# Patient Record
Sex: Female | Born: 1935 | Race: White | Hispanic: No | Smoking: Former smoker
Health system: Southern US, Community
[De-identification: ages and names within clinical notes are randomized; demographics above are authoritative.]

## PROBLEM LIST (undated history)

## (undated) DIAGNOSIS — Z5189 Encounter for other specified aftercare: Secondary | ICD-10-CM

## (undated) DIAGNOSIS — I10 Essential (primary) hypertension: Secondary | ICD-10-CM

## (undated) DIAGNOSIS — J449 Chronic obstructive pulmonary disease, unspecified: Secondary | ICD-10-CM

## (undated) DIAGNOSIS — I2510 Atherosclerotic heart disease of native coronary artery without angina pectoris: Secondary | ICD-10-CM

## (undated) DIAGNOSIS — I219 Acute myocardial infarction, unspecified: Secondary | ICD-10-CM

## (undated) HISTORY — PX: CORONARY ANGIOPLASTY: SHX604

## (undated) HISTORY — PX: CARDIAC CATHETERIZATION: SHX172

---

## 2011-04-17 DIAGNOSIS — J069 Acute upper respiratory infection, unspecified: Secondary | ICD-10-CM | POA: Diagnosis not present

## 2011-05-08 DIAGNOSIS — H6120 Impacted cerumen, unspecified ear: Secondary | ICD-10-CM | POA: Diagnosis not present

## 2011-05-08 DIAGNOSIS — F3342 Major depressive disorder, recurrent, in full remission: Secondary | ICD-10-CM | POA: Diagnosis not present

## 2011-05-08 DIAGNOSIS — I10 Essential (primary) hypertension: Secondary | ICD-10-CM | POA: Diagnosis not present

## 2011-05-08 DIAGNOSIS — E785 Hyperlipidemia, unspecified: Secondary | ICD-10-CM | POA: Diagnosis not present

## 2011-05-08 DIAGNOSIS — G2581 Restless legs syndrome: Secondary | ICD-10-CM | POA: Diagnosis not present

## 2011-05-27 ENCOUNTER — Other Ambulatory Visit: Payer: Self-pay | Admitting: Family Medicine

## 2011-05-27 ENCOUNTER — Ambulatory Visit
Admission: RE | Admit: 2011-05-27 | Discharge: 2011-05-27 | Disposition: A | Discharge status: Home / Self Care | Payer: Medicare Other | Source: Ambulatory Visit | Attending: Family Medicine | Admitting: Family Medicine

## 2011-05-27 DIAGNOSIS — R109 Unspecified abdominal pain: Secondary | ICD-10-CM

## 2011-05-27 DIAGNOSIS — K5730 Diverticulosis of large intestine without perforation or abscess without bleeding: Secondary | ICD-10-CM | POA: Diagnosis not present

## 2011-05-27 DIAGNOSIS — K5289 Other specified noninfective gastroenteritis and colitis: Secondary | ICD-10-CM | POA: Diagnosis not present

## 2011-05-27 MED ORDER — IOHEXOL 300 MG/ML  SOLN
100.0000 mL | Freq: Once | INTRAMUSCULAR | Status: AC | PRN
  Administered 2011-05-27: 100 mL via INTRAVENOUS

## 2011-05-27 MED ORDER — IOHEXOL 300 MG/ML  SOLN
40.0000 mL | Freq: Once | INTRAMUSCULAR | Status: AC | PRN
  Administered 2011-05-27: 40 mL via ORAL

## 2011-06-04 DIAGNOSIS — K5732 Diverticulitis of large intestine without perforation or abscess without bleeding: Secondary | ICD-10-CM | POA: Diagnosis not present

## 2011-07-21 DIAGNOSIS — Z9181 History of falling: Secondary | ICD-10-CM | POA: Diagnosis not present

## 2011-07-21 DIAGNOSIS — R5381 Other malaise: Secondary | ICD-10-CM | POA: Diagnosis not present

## 2011-07-21 DIAGNOSIS — R5383 Other fatigue: Secondary | ICD-10-CM | POA: Diagnosis not present

## 2011-07-21 DIAGNOSIS — J449 Chronic obstructive pulmonary disease, unspecified: Secondary | ICD-10-CM | POA: Diagnosis not present

## 2011-07-21 DIAGNOSIS — E871 Hypo-osmolality and hyponatremia: Secondary | ICD-10-CM | POA: Diagnosis not present

## 2011-07-21 DIAGNOSIS — W010XXA Fall on same level from slipping, tripping and stumbling without subsequent striking against object, initial encounter: Secondary | ICD-10-CM | POA: Diagnosis not present

## 2011-07-23 ENCOUNTER — Ambulatory Visit: Payer: Medicare Other | Attending: Family Medicine

## 2011-07-23 DIAGNOSIS — IMO0001 Reserved for inherently not codable concepts without codable children: Secondary | ICD-10-CM | POA: Diagnosis not present

## 2011-07-23 DIAGNOSIS — R262 Difficulty in walking, not elsewhere classified: Secondary | ICD-10-CM | POA: Insufficient documentation

## 2011-07-23 DIAGNOSIS — M6281 Muscle weakness (generalized): Secondary | ICD-10-CM | POA: Diagnosis not present

## 2011-07-24 ENCOUNTER — Ambulatory Visit
Admission: RE | Admit: 2011-07-24 | Discharge: 2011-07-24 | Disposition: A | Discharge status: Home / Self Care | Payer: Medicare Other | Source: Ambulatory Visit | Attending: Family Medicine | Admitting: Family Medicine

## 2011-07-24 ENCOUNTER — Other Ambulatory Visit: Payer: Self-pay | Admitting: Family Medicine

## 2011-07-24 DIAGNOSIS — K449 Diaphragmatic hernia without obstruction or gangrene: Secondary | ICD-10-CM | POA: Diagnosis not present

## 2011-07-24 DIAGNOSIS — I517 Cardiomegaly: Secondary | ICD-10-CM | POA: Diagnosis not present

## 2011-07-24 DIAGNOSIS — E871 Hypo-osmolality and hyponatremia: Secondary | ICD-10-CM

## 2011-07-24 DIAGNOSIS — D649 Anemia, unspecified: Secondary | ICD-10-CM | POA: Diagnosis not present

## 2011-07-27 ENCOUNTER — Ambulatory Visit: Payer: Medicare Other | Admitting: Physical Therapy

## 2011-07-27 DIAGNOSIS — IMO0001 Reserved for inherently not codable concepts without codable children: Secondary | ICD-10-CM | POA: Diagnosis not present

## 2011-07-27 DIAGNOSIS — R262 Difficulty in walking, not elsewhere classified: Secondary | ICD-10-CM | POA: Diagnosis not present

## 2011-07-27 DIAGNOSIS — M6281 Muscle weakness (generalized): Secondary | ICD-10-CM | POA: Diagnosis not present

## 2011-07-30 ENCOUNTER — Ambulatory Visit: Payer: Medicare Other

## 2011-07-30 DIAGNOSIS — J449 Chronic obstructive pulmonary disease, unspecified: Secondary | ICD-10-CM | POA: Diagnosis not present

## 2011-07-30 DIAGNOSIS — R262 Difficulty in walking, not elsewhere classified: Secondary | ICD-10-CM | POA: Diagnosis not present

## 2011-07-30 DIAGNOSIS — M6281 Muscle weakness (generalized): Secondary | ICD-10-CM | POA: Diagnosis not present

## 2011-07-30 DIAGNOSIS — D649 Anemia, unspecified: Secondary | ICD-10-CM | POA: Diagnosis not present

## 2011-07-30 DIAGNOSIS — E871 Hypo-osmolality and hyponatremia: Secondary | ICD-10-CM | POA: Diagnosis not present

## 2011-07-30 DIAGNOSIS — IMO0001 Reserved for inherently not codable concepts without codable children: Secondary | ICD-10-CM | POA: Diagnosis not present

## 2011-08-04 ENCOUNTER — Ambulatory Visit: Payer: Medicare Other | Admitting: Physical Therapy

## 2011-08-06 ENCOUNTER — Ambulatory Visit: Payer: Medicare Other | Admitting: Physical Therapy

## 2011-08-06 DIAGNOSIS — IMO0001 Reserved for inherently not codable concepts without codable children: Secondary | ICD-10-CM | POA: Diagnosis not present

## 2011-08-06 DIAGNOSIS — M6281 Muscle weakness (generalized): Secondary | ICD-10-CM | POA: Diagnosis not present

## 2011-08-06 DIAGNOSIS — R262 Difficulty in walking, not elsewhere classified: Secondary | ICD-10-CM | POA: Diagnosis not present

## 2011-08-11 ENCOUNTER — Ambulatory Visit: Payer: Medicare Other

## 2011-08-11 DIAGNOSIS — IMO0001 Reserved for inherently not codable concepts without codable children: Secondary | ICD-10-CM | POA: Diagnosis not present

## 2011-08-11 DIAGNOSIS — M6281 Muscle weakness (generalized): Secondary | ICD-10-CM | POA: Diagnosis not present

## 2011-08-11 DIAGNOSIS — R262 Difficulty in walking, not elsewhere classified: Secondary | ICD-10-CM | POA: Diagnosis not present

## 2011-08-13 ENCOUNTER — Ambulatory Visit: Payer: Medicare Other

## 2011-08-13 DIAGNOSIS — IMO0001 Reserved for inherently not codable concepts without codable children: Secondary | ICD-10-CM | POA: Diagnosis not present

## 2011-08-13 DIAGNOSIS — M6281 Muscle weakness (generalized): Secondary | ICD-10-CM | POA: Diagnosis not present

## 2011-08-13 DIAGNOSIS — R262 Difficulty in walking, not elsewhere classified: Secondary | ICD-10-CM | POA: Diagnosis not present

## 2011-08-20 ENCOUNTER — Ambulatory Visit: Payer: Medicare Other | Attending: Family Medicine

## 2011-08-20 DIAGNOSIS — R262 Difficulty in walking, not elsewhere classified: Secondary | ICD-10-CM | POA: Diagnosis not present

## 2011-08-20 DIAGNOSIS — M6281 Muscle weakness (generalized): Secondary | ICD-10-CM | POA: Insufficient documentation

## 2011-08-20 DIAGNOSIS — IMO0001 Reserved for inherently not codable concepts without codable children: Secondary | ICD-10-CM | POA: Insufficient documentation

## 2011-11-18 DIAGNOSIS — J449 Chronic obstructive pulmonary disease, unspecified: Secondary | ICD-10-CM | POA: Diagnosis not present

## 2011-11-18 DIAGNOSIS — F3342 Major depressive disorder, recurrent, in full remission: Secondary | ICD-10-CM | POA: Diagnosis not present

## 2011-11-18 DIAGNOSIS — I10 Essential (primary) hypertension: Secondary | ICD-10-CM | POA: Diagnosis not present

## 2011-11-18 DIAGNOSIS — E785 Hyperlipidemia, unspecified: Secondary | ICD-10-CM | POA: Diagnosis not present

## 2012-02-23 DIAGNOSIS — E871 Hypo-osmolality and hyponatremia: Secondary | ICD-10-CM | POA: Diagnosis not present

## 2012-02-23 DIAGNOSIS — I10 Essential (primary) hypertension: Secondary | ICD-10-CM | POA: Diagnosis not present

## 2012-02-23 DIAGNOSIS — J449 Chronic obstructive pulmonary disease, unspecified: Secondary | ICD-10-CM | POA: Diagnosis not present

## 2012-05-23 DIAGNOSIS — G2581 Restless legs syndrome: Secondary | ICD-10-CM | POA: Diagnosis not present

## 2012-05-23 DIAGNOSIS — J449 Chronic obstructive pulmonary disease, unspecified: Secondary | ICD-10-CM | POA: Diagnosis not present

## 2012-05-23 DIAGNOSIS — E785 Hyperlipidemia, unspecified: Secondary | ICD-10-CM | POA: Diagnosis not present

## 2012-05-23 DIAGNOSIS — I10 Essential (primary) hypertension: Secondary | ICD-10-CM | POA: Diagnosis not present

## 2012-05-23 DIAGNOSIS — L259 Unspecified contact dermatitis, unspecified cause: Secondary | ICD-10-CM | POA: Diagnosis not present

## 2012-06-08 IMAGING — CT CT ABD-PELV W/ CM
3 of 5 series · 12 of 36 positions shown, 18 images · IV contrast (OMNI 300, WATER & [ID] OMNI 300)
Comparison: None.

CLINICAL DATA: Abdominal pain, concern diverticulitis or ischemic
colitis.

CT ABDOMEN AND PELVIS WITH CONTRAST
TECHNIQUE: Multidetector CT imaging of the abdomen and pelvis was
performed following the standard protocol during bolus
administration of intravenous contrast.
Contrast: 40mL OMNIPAQUE IOHEXOL 300 MG/ML IJ SOLN, 100mL OMNIPAQUE
IOHEXOL 300 MG/ML IJ SOLN

[Series 3: abd/pelvis with · axial · 0.70mm/px · z∈[-310,-4]mm · 8 of 79 slices shown, 13 images]
[im 9/79  soft-tissue]
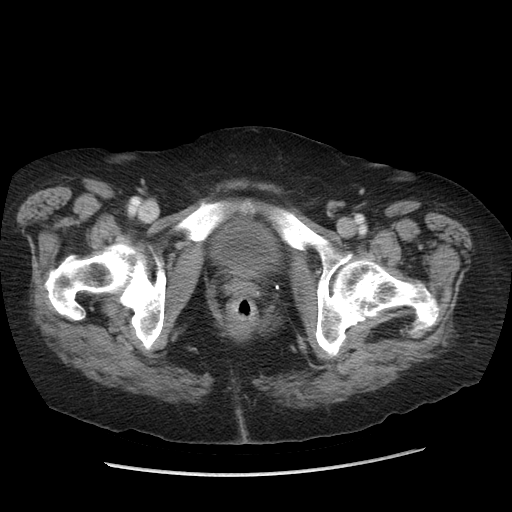
[im 9/79  bone]
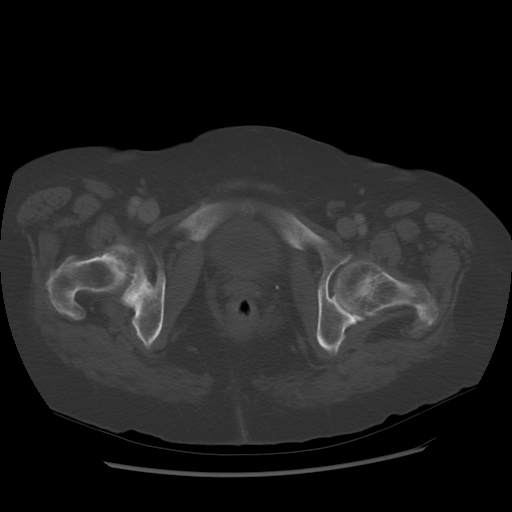
[im 18/79  soft-tissue]
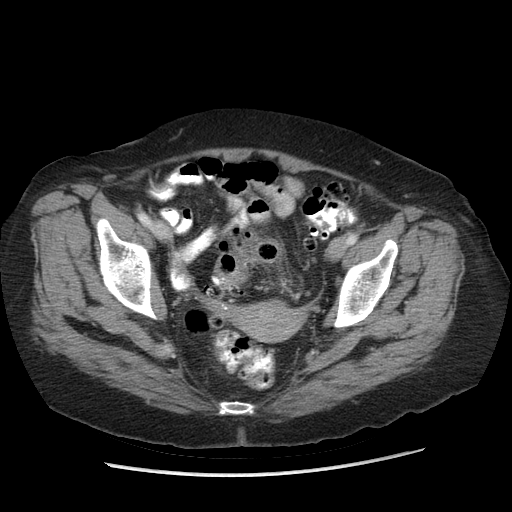
[im 27/79  soft-tissue]
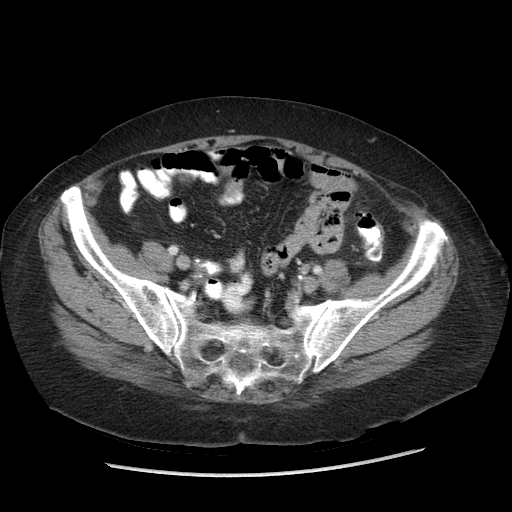
[im 35/79  soft-tissue]
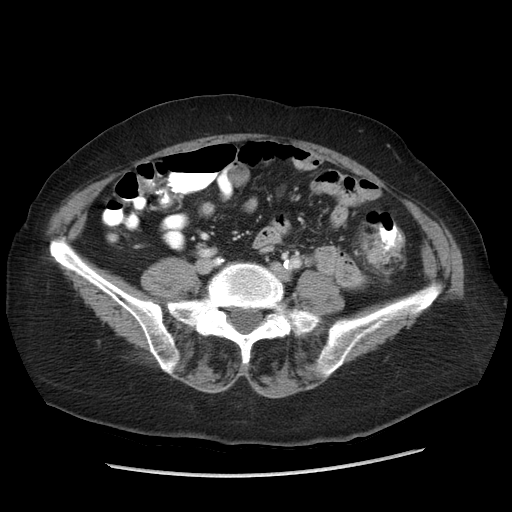
[im 44/79  soft-tissue]
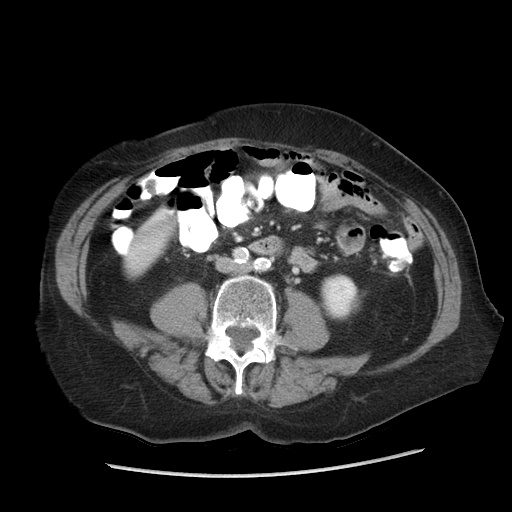
[im 44/79  lung]
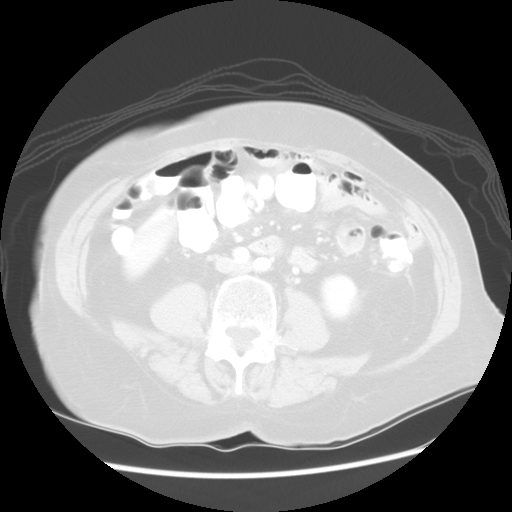
[im 53/79  soft-tissue]
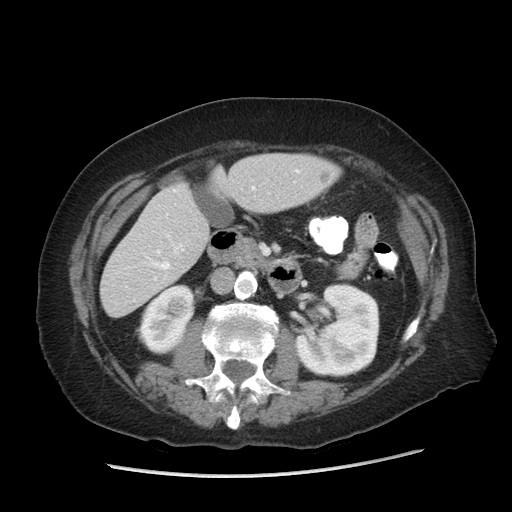
[im 53/79  lung]
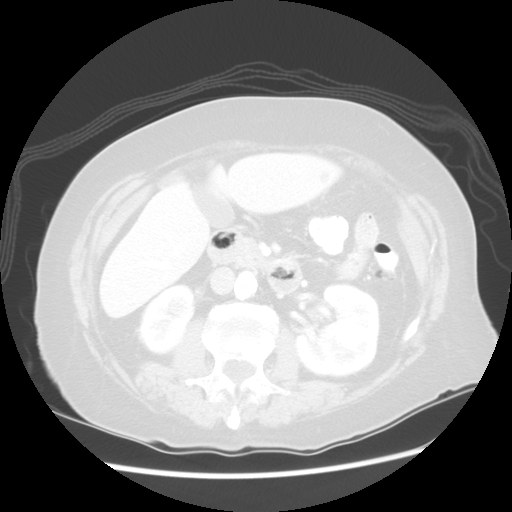
[im 61/79  soft-tissue]
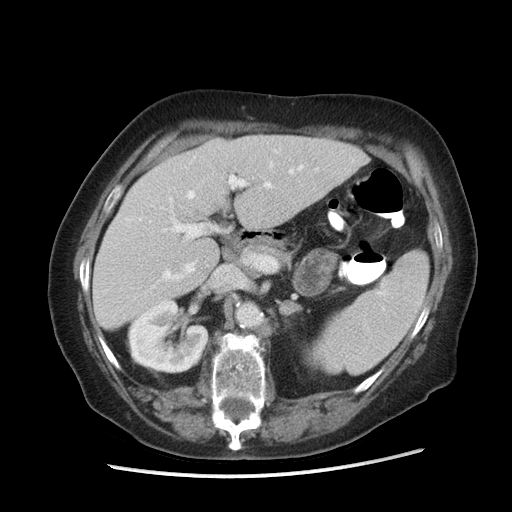
[im 61/79  lung]
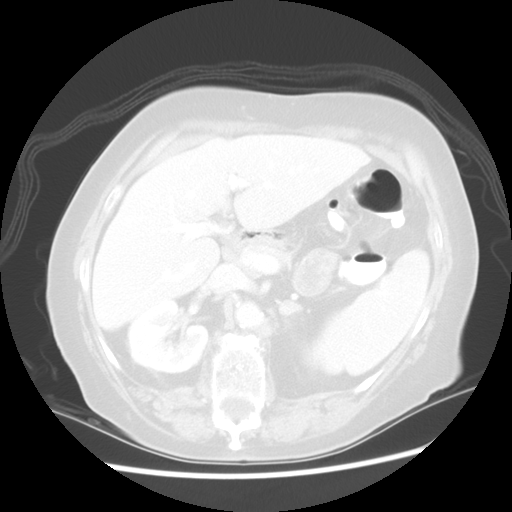
[im 70/79  soft-tissue]
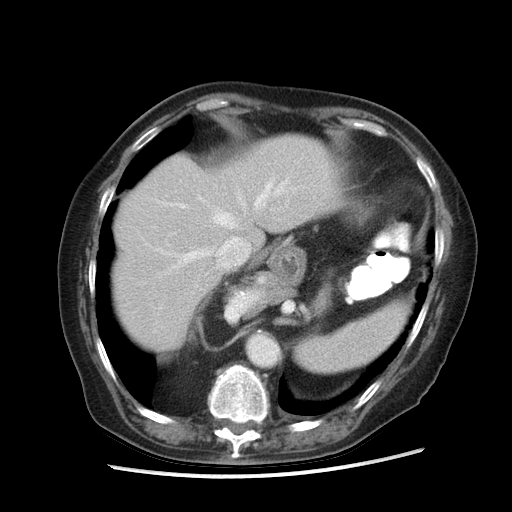
[im 70/79  lung]
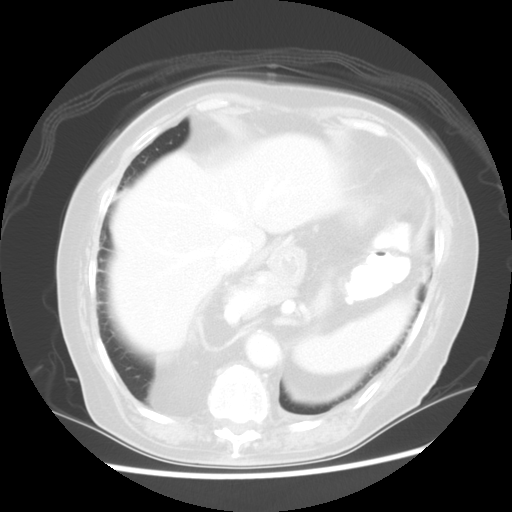

[Series 601: coronal body · coronal · 0.83mm/px · 1 of 117 slices shown, 2 images]
[im 39/117  soft-tissue]
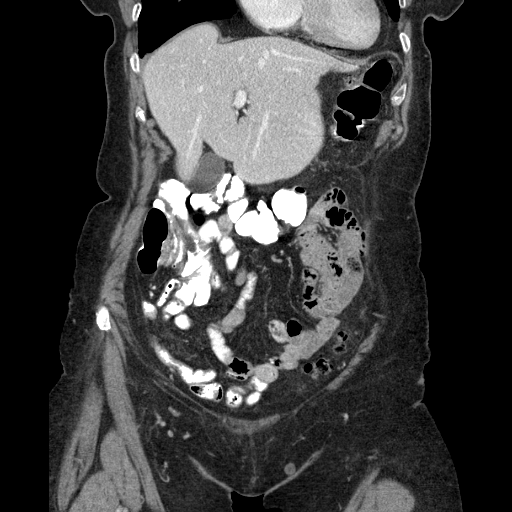
[im 39/117  bone]
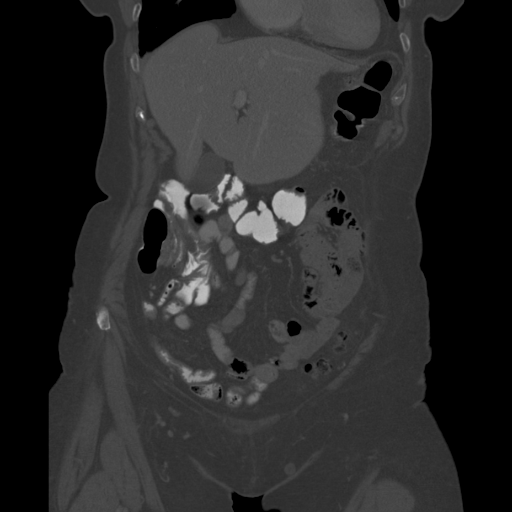

[Series 602: sagittal body · sagittal · 0.83mm/px · 3 of 145 slices shown]
[im 10/145  soft-tissue]
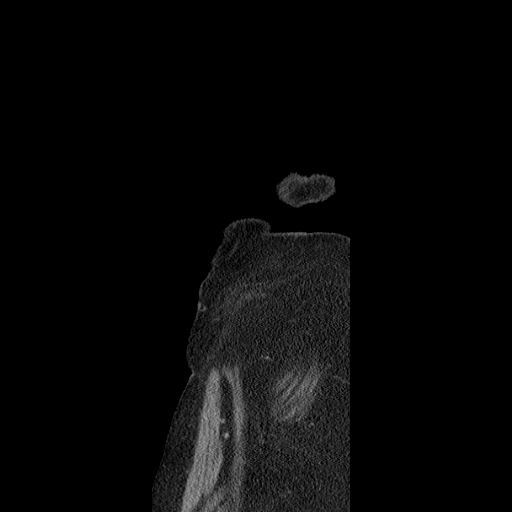
[im 28/145  soft-tissue]
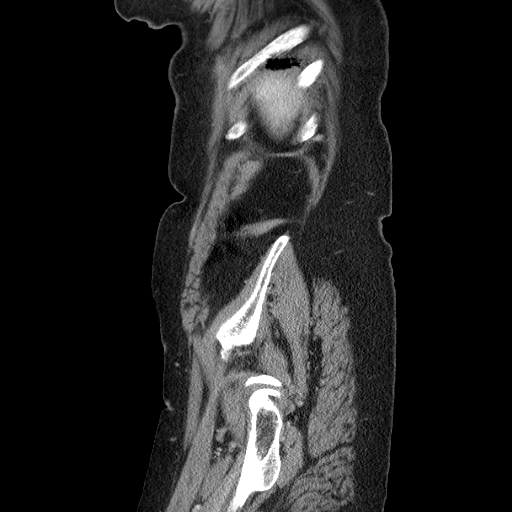
[im 46/145  soft-tissue]
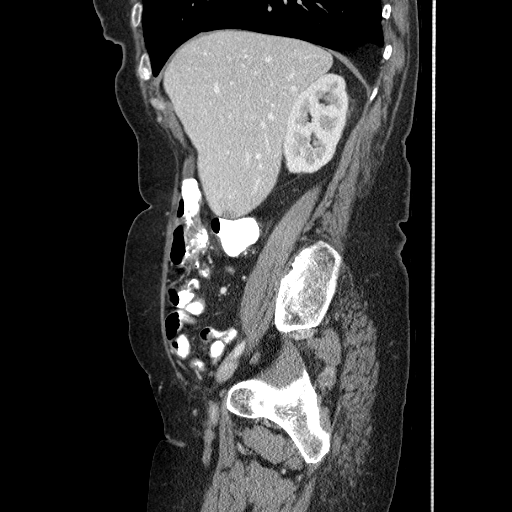

[12 of 36 positions shown; findings below may reference images not displayed]

FINDINGS: Lung bases are clear.  There is a large hiatal hernia
with the entire stomach above the hemidiaphragms within the hernia
sac.

There is a small hypodense lesion within the lateral left hepatic
lobe measuring 8 mm (image 27).  Gallbladder, pancreas, spleen,
adrenal glands, kidneys are normal.

The duodenum and small bowel are normal.  Appendix is normal.
Cecum is normal.  There is pericolonic inflammation within the fat
surrounding the mid descending colon (image 45).  There are several
diverticula through this region.  There is an enlarged and
diverticulum in the proximal sigmoid colon (coronal image 56) which
also demonstrates a pericolonic inflammation.  There are multiple
diverticula through the sigmoid colon region.  Contrast flows in
the rectum.  No evidence of macro perforation or abscess.

Abdominal aorta normal caliber.  No retroperitoneal periportal
lymphadenopathy.

No free fluid the pelvis.  The uterus and ovary normal.  No pelvic
lymphadenopathy. Review of  bone windows demonstrates no aggressive
osseous lesions.
IMPRESSION: 1..  Inflammation involving the mid descending colon as well as the
mid sigmoid colon are concerning for a two discrete foci of acute
diverticulitis.  There is no evidence of abscess or macro
perforation.
2.  Extensive diverticular disease of the descending colon and
sigmoid colon.
3.  Large hernia with the entire stomach above the hemidiaphragms.
4..  Small hypodensity within the lateral left hepatic lobe is
incompletely characterized..  Cannot exclude a small abscess in
patient with diverticulitis although this is less favored.

## 2012-06-21 DIAGNOSIS — I10 Essential (primary) hypertension: Secondary | ICD-10-CM | POA: Diagnosis not present

## 2012-06-21 DIAGNOSIS — M549 Dorsalgia, unspecified: Secondary | ICD-10-CM | POA: Diagnosis not present

## 2012-06-21 DIAGNOSIS — E871 Hypo-osmolality and hyponatremia: Secondary | ICD-10-CM | POA: Diagnosis not present

## 2012-06-28 ENCOUNTER — Ambulatory Visit
Admission: RE | Admit: 2012-06-28 | Discharge: 2012-06-28 | Disposition: A | Discharge status: Home / Self Care | Payer: Medicare Other | Source: Ambulatory Visit | Attending: Family Medicine | Admitting: Family Medicine

## 2012-06-28 ENCOUNTER — Other Ambulatory Visit: Payer: Self-pay | Admitting: Family Medicine

## 2012-06-28 DIAGNOSIS — M549 Dorsalgia, unspecified: Secondary | ICD-10-CM | POA: Diagnosis not present

## 2012-06-28 DIAGNOSIS — I10 Essential (primary) hypertension: Secondary | ICD-10-CM | POA: Diagnosis not present

## 2012-06-28 DIAGNOSIS — R0602 Shortness of breath: Secondary | ICD-10-CM | POA: Diagnosis not present

## 2012-06-28 DIAGNOSIS — S298XXA Other specified injuries of thorax, initial encounter: Secondary | ICD-10-CM | POA: Diagnosis not present

## 2012-06-28 DIAGNOSIS — S32009A Unspecified fracture of unspecified lumbar vertebra, initial encounter for closed fracture: Secondary | ICD-10-CM | POA: Diagnosis not present

## 2012-06-28 DIAGNOSIS — E871 Hypo-osmolality and hyponatremia: Secondary | ICD-10-CM | POA: Diagnosis not present

## 2012-09-01 DIAGNOSIS — M545 Low back pain: Secondary | ICD-10-CM | POA: Diagnosis not present

## 2012-09-01 DIAGNOSIS — S32009A Unspecified fracture of unspecified lumbar vertebra, initial encounter for closed fracture: Secondary | ICD-10-CM | POA: Diagnosis not present

## 2012-09-02 DIAGNOSIS — I10 Essential (primary) hypertension: Secondary | ICD-10-CM | POA: Diagnosis not present

## 2012-09-02 DIAGNOSIS — G2581 Restless legs syndrome: Secondary | ICD-10-CM | POA: Diagnosis not present

## 2012-09-02 DIAGNOSIS — E871 Hypo-osmolality and hyponatremia: Secondary | ICD-10-CM | POA: Diagnosis not present

## 2012-09-02 DIAGNOSIS — J449 Chronic obstructive pulmonary disease, unspecified: Secondary | ICD-10-CM | POA: Diagnosis not present

## 2012-09-02 DIAGNOSIS — M549 Dorsalgia, unspecified: Secondary | ICD-10-CM | POA: Diagnosis not present

## 2012-11-16 DIAGNOSIS — J449 Chronic obstructive pulmonary disease, unspecified: Secondary | ICD-10-CM | POA: Diagnosis not present

## 2012-11-16 DIAGNOSIS — Z23 Encounter for immunization: Secondary | ICD-10-CM | POA: Diagnosis not present

## 2012-11-16 DIAGNOSIS — I10 Essential (primary) hypertension: Secondary | ICD-10-CM | POA: Diagnosis not present

## 2012-11-16 DIAGNOSIS — E871 Hypo-osmolality and hyponatremia: Secondary | ICD-10-CM | POA: Diagnosis not present

## 2013-05-04 DIAGNOSIS — I2510 Atherosclerotic heart disease of native coronary artery without angina pectoris: Secondary | ICD-10-CM | POA: Diagnosis not present

## 2013-05-04 DIAGNOSIS — I252 Old myocardial infarction: Secondary | ICD-10-CM | POA: Diagnosis not present

## 2013-05-04 DIAGNOSIS — D649 Anemia, unspecified: Secondary | ICD-10-CM | POA: Diagnosis not present

## 2013-05-04 DIAGNOSIS — I10 Essential (primary) hypertension: Secondary | ICD-10-CM | POA: Diagnosis not present

## 2013-05-04 DIAGNOSIS — I509 Heart failure, unspecified: Secondary | ICD-10-CM | POA: Diagnosis not present

## 2013-06-26 DIAGNOSIS — E785 Hyperlipidemia, unspecified: Secondary | ICD-10-CM | POA: Diagnosis not present

## 2013-06-26 DIAGNOSIS — E871 Hypo-osmolality and hyponatremia: Secondary | ICD-10-CM | POA: Diagnosis not present

## 2013-06-26 DIAGNOSIS — J449 Chronic obstructive pulmonary disease, unspecified: Secondary | ICD-10-CM | POA: Diagnosis not present

## 2013-06-26 DIAGNOSIS — E559 Vitamin D deficiency, unspecified: Secondary | ICD-10-CM | POA: Diagnosis not present

## 2013-06-26 DIAGNOSIS — I10 Essential (primary) hypertension: Secondary | ICD-10-CM | POA: Diagnosis not present

## 2013-09-18 DIAGNOSIS — Z862 Personal history of diseases of the blood and blood-forming organs and certain disorders involving the immune mechanism: Secondary | ICD-10-CM | POA: Diagnosis not present

## 2013-09-18 DIAGNOSIS — R5381 Other malaise: Secondary | ICD-10-CM | POA: Diagnosis not present

## 2013-09-18 DIAGNOSIS — R42 Dizziness and giddiness: Secondary | ICD-10-CM | POA: Diagnosis not present

## 2013-09-18 DIAGNOSIS — E559 Vitamin D deficiency, unspecified: Secondary | ICD-10-CM | POA: Diagnosis not present

## 2013-09-18 DIAGNOSIS — E871 Hypo-osmolality and hyponatremia: Secondary | ICD-10-CM | POA: Diagnosis not present

## 2013-11-09 DIAGNOSIS — E559 Vitamin D deficiency, unspecified: Secondary | ICD-10-CM | POA: Diagnosis not present

## 2013-11-09 DIAGNOSIS — Z0000 Encounter for general adult medical examination without abnormal findings: Secondary | ICD-10-CM | POA: Diagnosis not present

## 2013-11-09 DIAGNOSIS — E785 Hyperlipidemia, unspecified: Secondary | ICD-10-CM | POA: Diagnosis not present

## 2013-11-09 DIAGNOSIS — N951 Menopausal and female climacteric states: Secondary | ICD-10-CM | POA: Diagnosis not present

## 2013-11-09 DIAGNOSIS — I10 Essential (primary) hypertension: Secondary | ICD-10-CM | POA: Diagnosis not present

## 2013-11-09 DIAGNOSIS — F3342 Major depressive disorder, recurrent, in full remission: Secondary | ICD-10-CM | POA: Diagnosis not present

## 2013-11-23 DIAGNOSIS — M810 Age-related osteoporosis without current pathological fracture: Secondary | ICD-10-CM | POA: Diagnosis not present

## 2014-01-30 DIAGNOSIS — R531 Weakness: Secondary | ICD-10-CM | POA: Diagnosis not present

## 2014-01-30 DIAGNOSIS — E860 Dehydration: Secondary | ICD-10-CM | POA: Diagnosis not present

## 2014-01-30 DIAGNOSIS — I10 Essential (primary) hypertension: Secondary | ICD-10-CM | POA: Diagnosis not present

## 2014-02-21 DIAGNOSIS — E871 Hypo-osmolality and hyponatremia: Secondary | ICD-10-CM | POA: Diagnosis not present

## 2014-02-21 DIAGNOSIS — J449 Chronic obstructive pulmonary disease, unspecified: Secondary | ICD-10-CM | POA: Diagnosis not present

## 2014-02-21 DIAGNOSIS — E559 Vitamin D deficiency, unspecified: Secondary | ICD-10-CM | POA: Diagnosis not present

## 2014-02-21 DIAGNOSIS — E785 Hyperlipidemia, unspecified: Secondary | ICD-10-CM | POA: Diagnosis not present

## 2014-02-21 DIAGNOSIS — I10 Essential (primary) hypertension: Secondary | ICD-10-CM | POA: Diagnosis not present

## 2014-02-21 DIAGNOSIS — Z23 Encounter for immunization: Secondary | ICD-10-CM | POA: Diagnosis not present

## 2014-04-20 ENCOUNTER — Emergency Department (HOSPITAL_COMMUNITY): Payer: Medicare Other

## 2014-04-20 ENCOUNTER — Other Ambulatory Visit: Payer: Self-pay | Admitting: Family Medicine

## 2014-04-20 ENCOUNTER — Ambulatory Visit
Admission: RE | Admit: 2014-04-20 | Discharge: 2014-04-20 | Disposition: A | Discharge status: Home / Self Care | Payer: Medicare Other | Source: Ambulatory Visit | Attending: Family Medicine | Admitting: Family Medicine

## 2014-04-20 ENCOUNTER — Encounter (HOSPITAL_COMMUNITY): Payer: Self-pay | Admitting: Emergency Medicine

## 2014-04-20 ENCOUNTER — Inpatient Hospital Stay (HOSPITAL_COMMUNITY)
Admission: RE | Admit: 2014-04-20 | Discharge: 2014-04-23 | DRG: 246 | Disposition: A | Discharge status: Home / Self Care | Payer: Medicare Other | Attending: Cardiology | Admitting: Cardiology

## 2014-04-20 DIAGNOSIS — M810 Age-related osteoporosis without current pathological fracture: Secondary | ICD-10-CM | POA: Diagnosis present

## 2014-04-20 DIAGNOSIS — D500 Iron deficiency anemia secondary to blood loss (chronic): Secondary | ICD-10-CM | POA: Diagnosis present

## 2014-04-20 DIAGNOSIS — I2109 ST elevation (STEMI) myocardial infarction involving other coronary artery of anterior wall: Secondary | ICD-10-CM | POA: Diagnosis not present

## 2014-04-20 DIAGNOSIS — Z87891 Personal history of nicotine dependence: Secondary | ICD-10-CM

## 2014-04-20 DIAGNOSIS — I214 Non-ST elevation (NSTEMI) myocardial infarction: Principal | ICD-10-CM | POA: Diagnosis present

## 2014-04-20 DIAGNOSIS — R0602 Shortness of breath: Secondary | ICD-10-CM | POA: Diagnosis not present

## 2014-04-20 DIAGNOSIS — E785 Hyperlipidemia, unspecified: Secondary | ICD-10-CM | POA: Diagnosis not present

## 2014-04-20 DIAGNOSIS — I10 Essential (primary) hypertension: Secondary | ICD-10-CM | POA: Diagnosis present

## 2014-04-20 DIAGNOSIS — R079 Chest pain, unspecified: Secondary | ICD-10-CM | POA: Diagnosis not present

## 2014-04-20 DIAGNOSIS — E780 Pure hypercholesterolemia: Secondary | ICD-10-CM | POA: Diagnosis present

## 2014-04-20 DIAGNOSIS — K449 Diaphragmatic hernia without obstruction or gangrene: Secondary | ICD-10-CM | POA: Diagnosis present

## 2014-04-20 DIAGNOSIS — J449 Chronic obstructive pulmonary disease, unspecified: Secondary | ICD-10-CM

## 2014-04-20 DIAGNOSIS — I5023 Acute on chronic systolic (congestive) heart failure: Secondary | ICD-10-CM | POA: Diagnosis present

## 2014-04-20 DIAGNOSIS — F17290 Nicotine dependence, other tobacco product, uncomplicated: Secondary | ICD-10-CM | POA: Diagnosis not present

## 2014-04-20 DIAGNOSIS — D649 Anemia, unspecified: Secondary | ICD-10-CM | POA: Diagnosis not present

## 2014-04-20 DIAGNOSIS — I5020 Unspecified systolic (congestive) heart failure: Secondary | ICD-10-CM | POA: Diagnosis not present

## 2014-04-20 DIAGNOSIS — R0789 Other chest pain: Secondary | ICD-10-CM | POA: Diagnosis not present

## 2014-04-20 DIAGNOSIS — M1990 Unspecified osteoarthritis, unspecified site: Secondary | ICD-10-CM | POA: Diagnosis present

## 2014-04-20 DIAGNOSIS — R52 Pain, unspecified: Secondary | ICD-10-CM

## 2014-04-20 DIAGNOSIS — R7309 Other abnormal glucose: Secondary | ICD-10-CM | POA: Diagnosis present

## 2014-04-20 HISTORY — DX: Acute myocardial infarction, unspecified: I21.9

## 2014-04-20 HISTORY — DX: Encounter for other specified aftercare: Z51.89

## 2014-04-20 HISTORY — DX: Atherosclerotic heart disease of native coronary artery without angina pectoris: I25.10

## 2014-04-20 HISTORY — DX: Essential (primary) hypertension: I10

## 2014-04-20 HISTORY — DX: Chronic obstructive pulmonary disease, unspecified: J44.9

## 2014-04-20 LAB — I-STAT TROPONIN, ED: TROPONIN I, POC: 1.47 ng/mL — AB (ref 0.00–0.08)

## 2014-04-20 LAB — CBC
HCT: 35.4 % — ABNORMAL LOW (ref 36.0–46.0)
HEMATOCRIT: 36.1 % (ref 36.0–46.0)
Hemoglobin: 11.5 g/dL — ABNORMAL LOW (ref 12.0–15.0)
Hemoglobin: 11.4 g/dL — ABNORMAL LOW (ref 12.0–15.0)
MCH: 30.6 pg (ref 26.0–34.0)
MCH: 30.8 pg (ref 26.0–34.0)
MCHC: 31.9 g/dL (ref 30.0–36.0)
MCHC: 32.2 g/dL (ref 30.0–36.0)
MCV: 96 fL (ref 78.0–100.0)
MCV: 95.7 fL (ref 78.0–100.0)
Platelets: 221 10*3/uL (ref 150–400)
Platelets: 205 10*3/uL (ref 150–400)
RBC: 3.76 MIL/uL — ABNORMAL LOW (ref 3.87–5.11)
RBC: 3.7 MIL/uL — ABNORMAL LOW (ref 3.87–5.11)
RDW: 12.5 % (ref 11.5–15.5)
RDW: 12.4 % (ref 11.5–15.5)
WBC: 7.5 10*3/uL (ref 4.0–10.5)
WBC: 6.4 10*3/uL (ref 4.0–10.5)

## 2014-04-20 LAB — BASIC METABOLIC PANEL
ANION GAP: 8 (ref 5–15)
BUN: 18 mg/dL (ref 6–23)
CALCIUM: 8.7 mg/dL (ref 8.4–10.5)
CHLORIDE: 101 mmol/L (ref 96–112)
CO2: 24 mmol/L (ref 19–32)
Creatinine, Ser: 1.04 mg/dL (ref 0.50–1.10)
GFR calc Af Amer: 57 mL/min — ABNORMAL LOW (ref >90)
GFR, EST NON AFRICAN AMERICAN: 49 mL/min — AB (ref >90)
Glucose, Bld: 118 mg/dL — ABNORMAL HIGH (ref 70–99)
Potassium: 3.7 mmol/L (ref 3.5–5.1)
Sodium: 133 mmol/L — ABNORMAL LOW (ref 135–145)

## 2014-04-20 LAB — APTT: aPTT: 37 seconds (ref 24–37)

## 2014-04-20 LAB — PROTIME-INR
INR: 0.98 (ref 0.00–1.49)
Prothrombin Time: 13.1 seconds (ref 11.6–15.2)

## 2014-04-20 LAB — BRAIN NATRIURETIC PEPTIDE: B Natriuretic Peptide: 1037.5 pg/mL — ABNORMAL HIGH (ref 0.0–100.0)

## 2014-04-20 MED ORDER — NITROGLYCERIN IN D5W 200-5 MCG/ML-% IV SOLN
0.0000 ug/min | INTRAVENOUS | Status: DC
  Administered 2014-04-20: 5 ug/min via INTRAVENOUS
  Filled 2014-04-20: qty 250

## 2014-04-20 MED ORDER — NITROGLYCERIN 0.4 MG SL SUBL
0.4000 mg | SUBLINGUAL_TABLET | SUBLINGUAL | Status: DC | PRN
Start: 2014-04-20 — End: 2014-04-23

## 2014-04-20 MED ORDER — METOPROLOL TARTRATE 12.5 MG HALF TABLET
12.5000 mg | ORAL_TABLET | Freq: Two times a day (BID) | ORAL | Status: DC
  Administered 2014-04-20 – 2014-04-22 (×4): 12.5 mg via ORAL
  Filled 2014-04-20 (×5): qty 1

## 2014-04-20 MED ORDER — HEPARIN (PORCINE) IN NACL 100-0.45 UNIT/ML-% IJ SOLN
950.0000 [IU]/h | INTRAMUSCULAR | Status: DC
  Administered 2014-04-20 (×2): 800 [IU]/h via INTRAVENOUS
  Filled 2014-04-20 (×3): qty 250

## 2014-04-20 MED ORDER — HEPARIN (PORCINE) IN NACL 100-0.45 UNIT/ML-% IJ SOLN: 10.0000 [IU]/kg/h | INTRAMUSCULAR | Status: DC

## 2014-04-20 MED ORDER — CLONIDINE HCL 0.1 MG PO TABS
0.1000 mg | ORAL_TABLET | Freq: Two times a day (BID) | ORAL | Status: DC
  Administered 2014-04-20 – 2014-04-21 (×2): 0.1 mg via ORAL
  Filled 2014-04-20 (×3): qty 1

## 2014-04-20 MED ORDER — CLOPIDOGREL BISULFATE 75 MG PO TABS
75.0000 mg | ORAL_TABLET | Freq: Every day | ORAL | Status: DC
  Administered 2014-04-21 – 2014-04-23 (×3): 75 mg via ORAL
  Filled 2014-04-20 (×4): qty 1

## 2014-04-20 MED ORDER — ASPIRIN 81 MG PO CHEW
324.0000 mg | CHEWABLE_TABLET | Freq: Once | ORAL | Status: DC
  Filled 2014-04-20: qty 4

## 2014-04-20 MED ORDER — HEPARIN BOLUS VIA INFUSION
1400.0000 [IU] | Freq: Once | INTRAVENOUS | Status: AC
  Administered 2014-04-20: 1400 [IU] via INTRAVENOUS
  Filled 2014-04-20: qty 1400

## 2014-04-20 MED ORDER — INSULIN ASPART 100 UNIT/ML ~~LOC~~ SOLN: 0.0000 [IU] | Freq: Three times a day (TID) | SUBCUTANEOUS | Status: DC

## 2014-04-20 MED ORDER — NITROGLYCERIN IN D5W 200-5 MCG/ML-% IV SOLN: 10.0000 ug/min | INTRAVENOUS | Status: DC

## 2014-04-20 MED ORDER — MORPHINE SULFATE 2 MG/ML IJ SOLN
1.0000 mg | INTRAMUSCULAR | Status: DC | PRN
  Administered 2014-04-21 (×3): 1 mg via INTRAVENOUS
  Filled 2014-04-20 (×3): qty 1

## 2014-04-20 MED ORDER — ATORVASTATIN CALCIUM 40 MG PO TABS
40.0000 mg | ORAL_TABLET | Freq: Every day | ORAL | Status: DC
  Administered 2014-04-20 – 2014-04-22 (×3): 40 mg via ORAL
  Filled 2014-04-20 (×4): qty 1

## 2014-04-20 MED ORDER — PANTOPRAZOLE SODIUM 40 MG PO TBEC
40.0000 mg | DELAYED_RELEASE_TABLET | Freq: Two times a day (BID) | ORAL | Status: DC
  Administered 2014-04-20 – 2014-04-23 (×6): 40 mg via ORAL
  Filled 2014-04-20 (×6): qty 1

## 2014-04-20 MED ORDER — ASPIRIN 300 MG RE SUPP: 300.0000 mg | RECTAL | Status: DC

## 2014-04-20 MED ORDER — ONDANSETRON HCL 4 MG/2ML IJ SOLN
4.0000 mg | Freq: Four times a day (QID) | INTRAMUSCULAR | Status: DC | PRN
Start: 2014-04-20 — End: 2014-04-23
  Administered 2014-04-21: 4 mg via INTRAVENOUS
  Filled 2014-04-20: qty 2

## 2014-04-20 MED ORDER — RAMIPRIL 5 MG PO CAPS
5.0000 mg | ORAL_CAPSULE | Freq: Every day | ORAL | Status: DC
  Administered 2014-04-20 – 2014-04-21 (×2): 5 mg via ORAL
  Filled 2014-04-20 (×2): qty 1

## 2014-04-20 MED ORDER — ACETAMINOPHEN 325 MG PO TABS
650.0000 mg | ORAL_TABLET | ORAL | Status: DC | PRN
  Administered 2014-04-21 (×2): 650 mg via ORAL
  Filled 2014-04-20 (×2): qty 2

## 2014-04-20 MED ORDER — ACETAMINOPHEN 325 MG PO TABS: 650.0000 mg | ORAL_TABLET | Freq: Four times a day (QID) | ORAL | Status: DC | PRN

## 2014-04-20 MED ORDER — ASPIRIN 81 MG PO CHEW: 324.0000 mg | CHEWABLE_TABLET | ORAL | Status: DC

## 2014-04-20 MED ORDER — ASPIRIN EC 81 MG PO TBEC
81.0000 mg | DELAYED_RELEASE_TABLET | Freq: Every day | ORAL | Status: DC
  Administered 2014-04-21 – 2014-04-23 (×3): 81 mg via ORAL
  Filled 2014-04-20 (×3): qty 1

## 2014-04-20 MED ORDER — CLOPIDOGREL BISULFATE 300 MG PO TABS
300.0000 mg | ORAL_TABLET | Freq: Once | ORAL | Status: AC
  Administered 2014-04-20: 300 mg via ORAL
  Filled 2014-04-20: qty 1

## 2014-04-20 MED ORDER — SODIUM CHLORIDE 0.9 % IV SOLN
INTRAVENOUS | Status: DC
  Administered 2014-04-21: via INTRAVENOUS

## 2014-04-20 NOTE — H&P (Signed)
April Rice is an 81 y.o. female.   Chief Complaint:  Chest  pain associated with shortness of breath HPI: Patient is 81 year old female with past medical history significant for hypertension, COPD, remote tobacco abuse, hiatus hernia, history of hyponatremia, chronic anemia, degenerative joint disease, complaints off recurrent retrosternal chest pressure described as heaviness associated with shortness of breath off and on for 2 days states last night her chest pressure was severe described as heaviness associated with shortness of breath radiating to right side of neck so decided to go to [facility]D this morning had labs done and was noted to have elevated troponin I about 3 and was advised to come to the ED. EKG done in the ER showed normal sinus rhythm with Q waves in V1 and V2 and ST T-wave changes in anterolateral leads and was noted to have troponin I of 1.47 which is trending down. Patient presently denies any chest pain. Denies any nausea vomiting diaphoresis. Denies palpitation lightheadedness or syncope. States had similar discomfort approximately one week ago associated with shortness of breath but did not seek medical attention.  No past medical history on file.  No past surgical history on file.  No family history on file. Social History:  has no tobacco, alcohol, and drug history on file.  Allergies: No Known Allergies   (Not in a hospital admission)  Results for orders placed or performed during the hospital encounter of 04/20/14 (from the past 48 hour(s))  CBC     Status: Abnormal   Collection Time: 04/20/14  4:29 PM  Result Value Ref Range   WBC 7.5 4.0 - 10.5 K/uL   RBC 3.76 (L) 3.87 - 5.11 MIL/uL   Hemoglobin 11.5 (L) 12.0 - 15.0 g/dL   HCT 36.1 36.0 - 46.0 %   MCV 96.0 78.0 - 100.0 fL   MCH 30.6 26.0 - 34.0 pg   MCHC 31.9 30.0 - 36.0 g/dL   RDW 12.5 11.5 - 15.5 %   Platelets 221 150 - 400 K/uL  Basic metabolic panel     Status: Abnormal   Collection Time: 04/20/14  4:29 PM   Result Value Ref Range   Sodium 133 (L) 135 - 145 mmol/L   Potassium 3.7 3.5 - 5.1 mmol/L   Chloride 101 96 - 112 mmol/L   CO2 24 19 - 32 mmol/L   Glucose, Bld 118 (H) 70 - 99 mg/dL   BUN 18 6 - 23 mg/dL   Creatinine, Ser 1.04 0.50 - 1.10 mg/dL   Calcium 8.7 8.4 - 10.5 mg/dL   GFR calc non Af Amer 49 (L) >90 mL/min   GFR calc Af Amer 57 (L) >90 mL/min    Comment: (NOTE) The eGFR has been calculated using the CKD EPI equation. This calculation has not been validated in all clinical situations. eGFR's persistently <90 mL/min signify possible Chronic Kidney Disease.    Anion gap 8 5 - 15  BNP (order ONLY if patient complains of dyspnea/SOB AND you have documented it for THIS visit)     Status: Abnormal   Collection Time: 04/20/14  4:29 PM  Result Value Ref Range   B Natriuretic Peptide 1037.5 (H) 0.0 - 100.0 pg/mL  I-stat troponin, ED (not at [facility])     Status: Abnormal   Collection Time: 04/20/14  4:33 PM  Result Value Ref Range   Troponin i, poc 1.47 (HH) 0.00 - 0.08 ng/mL   Comment NOTIFIED PHYSICIAN    Comment 3  Comment: Due to the release kinetics of cTnI, a negative result within the first hours of the onset of symptoms does not rule out myocardial infarction with certainty. If myocardial infarction is still suspected, repeat the test at appropriate intervals.    Dg Chest 2 View  04/20/2014   CLINICAL DATA:  Increased shortness breath, chest pain, right-sided neck pain  EXAM: CHEST  2 VIEW  COMPARISON:  06/28/2012  FINDINGS: There is a large hiatal hernia. A portion of the colon may also be present within the thorax. The lungs are hyperinflated likely secondary to COPD. There is no focal parenchymal opacity, pleural effusion, or pneumothorax. The heart and mediastinal contours are stable.  There is a T12 vertebral body compression fracture with kyphosis centered at T12.  IMPRESSION: No active cardiopulmonary disease.  Large hiatal hernia with nearly the entire  stomach within the thorax. A portion of the colon may also be present within the thorax.   Electronically Signed   By: Kathreen Devoid   On: 04/20/2014 13:36    Review of Systems  Constitutional: Negative for fever and chills.  Eyes: Negative for blurred vision and double vision.  Respiratory: Positive for cough and shortness of breath. Negative for sputum production and wheezing.   Cardiovascular: Positive for chest pain. Negative for palpitations, orthopnea, claudication and leg swelling.  Gastrointestinal: Negative for nausea, vomiting and abdominal pain.  Genitourinary: Negative for dysuria.  Neurological: Negative for dizziness and headaches.    Blood pressure 163/72, pulse 82, temperature 98.1 F (36.7 C), temperature source Oral, resp. rate 23, height 5' (1.524 m), weight 58.06 kg (128 lb), SpO2 96 %. Physical Exam  Constitutional: She is oriented to person, place, and time.  HENT:  Head: Normocephalic and atraumatic.  Eyes: Conjunctivae are normal. Left eye exhibits no discharge. No scleral icterus.  Neck: Normal range of motion. Neck supple. No JVD present. No tracheal deviation present. No thyromegaly present.  Cardiovascular: Normal rate and regular rhythm.   Murmur (Soft systolic murmur and S3 gallop noted) heard. Respiratory:  Decreased breath sound at bases with faint rales  GI: Soft. Bowel sounds are normal. She exhibits no distension. There is no tenderness.  Musculoskeletal: She exhibits no edema or tenderness.  Neurological: She is alert and oriented to person, place, and time.     Assessment/Plan Probable recent anteroseptal wall MI Mild decompensated acute systolic failure and COPD COPD Hypertension Remote tobacco abuse Degenerative joint disease Osteoporosis with T12 compression vertebral body fracture Chronic anemia Prediabetic Plan As per orders Discussed with patient and her daughter and granddaughter at length regarding left cardiac cath possible PTCA  stenting its risk and benefits i.e. death MI stroke need for emergency CABG local vascular complications etc. and consented for procedure. Will schedule for Monday unless patient has recurrent chest pain progressive worsening cardiac enzymes or EKG changes. Will transfer patient to [facility] 04/20/2014, 6:22 PM

## 2014-04-20 NOTE — ED Notes (Signed)
Pt family sts that pt has R shoulder muscle pain that is relieved with using BioFreeze . Family requests to put cream on shoulder, was advised that it is ok.

## 2014-04-20 NOTE — Progress Notes (Signed)
eLink Physician-Brief Progress Note Patient Name: April ComberMozelle Rice DOB: May 15, 1931 MRN: [id number]   Date of Service  04/20/2014  HPI/Events of Note  [age] f with PMH sign for MMP including HTN/COPD who presents with SSCP and s/s suggestive of probable recent anteroseptal MI.  She is currently HD stable with plans for NTG and Heparin gtt per primary team.  Plan is for scheduled CC on Monday 2/8.  She is listed as prediabetic  eICU Interventions  Plan of care per cardiology [facility] Monitor blood sugars given pre-diabetic status     Intervention Category Evaluation Type: New Patient Evaluation  Teruo Stilley 04/20/2014, 11:38 PM

## 2014-04-20 NOTE — ED Notes (Addendum)
Pt with Hx of COPD saw PCP for SOB, chest and neck pain today, troponin level was drawn and found to be 3.08 ng/mL, abnormal EKG. Pt states that pains in chest have since resolved.

## 2014-04-20 NOTE — Progress Notes (Signed)
ANTICOAGULATION CONSULT NOTE - Initial Consult  Pharmacy Consult for Heparin Indication: NSTEMI  No Known Allergies  Patient Measurements: Weight: 128 lb (58.06 kg)  Height: 60 inches Ideal body weight: 45.5 kg Heparin Dosing Weight: 58 kg  Vital Signs: Temp: 98.1 F (36.7 C) (02/05 1634) Temp Source: Oral (02/05 1634) BP: 163/72 mmHg (02/05 1634) Pulse Rate: 82 (02/05 1634)  Labs:  Recent Labs  04/20/14 1629  HGB 11.5*  HCT 36.1  PLT 221  CREATININE 1.04    CrCl cannot be calculated (Unknown ideal weight.).   Medical History: No past medical history on file.  Medications:  Scheduled:   Infusions:  . nitroGLYCERIN 5 mcg/min (04/20/14 1752)   PTA: Aspirin 325mg  x 1 Albuterol Citalopram Clonidine Tylenol PM Meloxicam Biofreeze Nebivolol Olmesartan Omeprazole Simvastatin  Assessment: 81 yo female with no known previous cardiac history presents from PCP office for evaluation of chest pain and elevated troponin. Pharmacy is consulted to dose IV heparin for NSTEMI.  Goal of Therapy:  Heparin level 0.3-0.7 units/ml Monitor platelets by anticoagulation protocol: Yes   Plan:   Heparin 1400 units IV  bolus  Heparin 800 units/hr IV infusion  Check heparin level in 8 hrs  Daily heparin level and CBC  Loralee PacasErin Addilyn Satterwhite, PharmD, BCPS Pager: [phone] 04/20/2014,6:01 PM

## 2014-04-20 NOTE — ED Provider Notes (Signed)
CSN: [id number]     Arrival date & time 04/20/14  1624 History   First MD Initiated Contact with Patient 04/20/14 1645     Chief Complaint  Patient presents with  . Troponin of 3      (Consider location/radiation/quality/duration/timing/severity/associated sxs/prior Treatment) HPI   April Rice is a 81 y.o. female who presents for evaluation of chest pain and elevated troponin.  She had an episode of chest pain last night for several hours that she describes as a tight feeling.  The pain resolved spontaneously.  She went to see her doctor today for a checkup.  He ordered labs and sent her here because her troponin was elevated.  She's never had cardiac disease previously.  She has an ongoing cough productive of sputum, which is usual secondary to her COPD.  She denies fever, chills, weakness, dizziness, nausea, vomiting, or difficulty ambulating.  She is taking her medication as prescribed.  There are no other known modifying factors.   No past medical history on file. No past surgical history on file. No family history on file. History  Substance Use Topics  . Smoking status: Not on file  . Smokeless tobacco: Not on file  . Alcohol Use: Not on file   OB History    No data available     Review of Systems  All other systems reviewed and are negative.     Allergies  Review of patient's allergies indicates not on file.  Home Medications   Prior to Admission medications   Not on File   BP 163/72 mmHg  Pulse 82  Temp(Src) 98.1 F (36.7 C) (Oral)  Resp 23  SpO2 96% Physical Exam  Constitutional: She is oriented to person, place, and time. She appears well-developed.  Elderly, frail  HENT:  Head: Normocephalic and atraumatic.  Right Ear: External ear normal.  Left Ear: External ear normal.  Eyes: Conjunctivae and EOM are normal. Pupils are equal, round, and reactive to light.  Neck: Normal range of motion and phonation normal. Neck supple.  Cardiovascular: Normal  rate, regular rhythm and normal heart sounds.   Pulmonary/Chest: Effort normal and breath sounds normal. No respiratory distress. She exhibits no tenderness and no bony tenderness.  Abdominal: Soft. There is no tenderness.  Musculoskeletal: Normal range of motion. She exhibits no edema or tenderness.  Neurological: She is alert and oriented to person, place, and time. No cranial nerve deficit or sensory deficit. She exhibits normal muscle tone. Coordination normal.  Skin: Skin is warm, dry and intact.  Psychiatric: She has a normal mood and affect. Her behavior is normal. Judgment and thought content normal.  Nursing note and vitals reviewed.   ED Course  Procedures (including critical care time) Medications  aspirin chewable tablet 324 mg (not administered)  heparin ADULT infusion 100 units/mL (25000 units/250 mL) (not administered)  nitroGLYCERIN 50 mg in dextrose 5 % 250 mL (0.2 mg/mL) infusion (not administered)   Treatment initiated for an NSTEMI  Patient Vitals for the past 24 hrs:  BP Temp Temp src Pulse Resp SpO2  04/20/14 1634 163/72 mmHg 98.1 F (36.7 C) Oral 82 23 96 %   17:45- he's discussed with cardiology, Dr. Sharyn LullHarwani. He will see the patient in the ED, and arrange for treatment.  5:52 PM Reevaluation with update and discussion. After initial assessment and treatment, an updated evaluation reveals she remains comfortable.  She continues to have absence of chest pain.Mancel Bale. Jahrell Hamor L   CRITICAL CARE Performed by: Flint MelterWENTZ,Mayara Paulson L  Total critical care time: 30 minutes Critical care time was exclusive of separately billable procedures and treating other patients. Critical care was necessary to treat or prevent imminent or life-threatening deterioration. Critical care was time spent personally by me on the following activities: development of treatment plan with patient and/or surrogate as well as nursing, discussions with consultants, evaluation of patient's response to  treatment, examination of patient, obtaining history from patient or surrogate, ordering and performing treatments and interventions, ordering and review of laboratory studies, ordering and review of radiographic studies, pulse oximetry and re-evaluation of patient's condition.   Labs Review Labs Reviewed  I-STAT TROPOININ, ED - Abnormal; Notable for the following:    Troponin i, poc 1.47 (*)    All other components within normal limits  CBC  BASIC METABOLIC PANEL  BRAIN NATRIURETIC PEPTIDE    Imaging Review Dg Chest 2 View  04/20/2014   CLINICAL DATA:  Increased shortness breath, chest pain, right-sided neck pain  EXAM: CHEST  2 VIEW  COMPARISON:  06/28/2012  FINDINGS: There is a large hiatal hernia. A portion of the colon may also be present within the thorax. The lungs are hyperinflated likely secondary to COPD. There is no focal parenchymal opacity, pleural effusion, or pneumothorax. The heart and mediastinal contours are stable.  There is a T12 vertebral body compression fracture with kyphosis centered at T12.  IMPRESSION: No active cardiopulmonary disease.  Large hiatal hernia with nearly the entire stomach within the thorax. A portion of the colon may also be present within the thorax.   Electronically Signed   By: Elige Ko   On: 04/20/2014 13:36     EKG Interpretation   Date/Time:  Friday April 20 2014 16:31:44 EST Ventricular Rate:  83 PR Interval:  167 QRS Duration: 76 QT Interval:  334 QTC Calculation: 392 R Axis:   42 Text Interpretation:  Sinus rhythm Borderline low voltage, extremity leads  Anteroseptal infarct, old Artifact No old tracing to compare Confirmed by  [facility]  MD, Dacey Milberger [phone]) on 04/20/2014 4:50:19 PM        EKG Interpretation  Date/Time:  Friday April 20 2014 16:57:58 EST Ventricular Rate:  86 PR Interval:  167 QRS Duration: 82 QT Interval:  413 QTC Calculation: 494 R Axis:   50 Text Interpretation:  Sinus rhythm Anteroseptal infarct, old  Abnrm T, consider ischemia, anterolateral lds Since last tracing of earlier today artifact resolved and now is c/w anterolateral ischemia Confirmed by Effie Shy  MD, Mechele Collin ([id number]) on 04/20/2014 5:40:19 PM        MDM   Final diagnoses:  NSTEMI (non-ST elevated myocardial infarction)     Nursing Notes Reviewed/ Care Coordinated, and agree without changes. Applicable Imaging Reviewed.  Interpretation of Laboratory Data incorporated into ED treatment  Plan: Admit   Flint Melter, MD 04/20/14 1754

## 2014-04-20 NOTE — ED Notes (Signed)
Pt sts that she had SOB and R shoulder pain last pm. Pt sts that she has COPD and is always SOB. Pt denies pain at this time. Pt is A&O and in NAD

## 2014-04-20 NOTE — ED Notes (Signed)
MD at bedside. 

## 2014-04-20 NOTE — ED Notes (Signed)
Dr. Harwani at bedside. 

## 2014-04-20 NOTE — ED Notes (Signed)
Family sts that they gave pt 325 mg ASA PTA. Order for ASA cancelled

## 2014-04-21 ENCOUNTER — Encounter (HOSPITAL_COMMUNITY): Payer: Self-pay | Admitting: Cardiology

## 2014-04-21 ENCOUNTER — Encounter (HOSPITAL_COMMUNITY)
Admission: RE | Disposition: A | Discharge status: Home / Self Care | Payer: Medicare Other | Source: Home / Self Care | Attending: Cardiology

## 2014-04-21 HISTORY — PX: LEFT HEART CATHETERIZATION WITH CORONARY ANGIOGRAM: SHX5451

## 2014-04-21 LAB — TROPONIN I
TROPONIN I: 1.89 ng/mL — AB (ref <0.031)
Troponin I: 2.43 ng/mL (ref <0.031)
Troponin I: 2.08 ng/mL (ref <0.031)
Troponin I: 1.81 ng/mL (ref <0.031)

## 2014-04-21 LAB — COMPREHENSIVE METABOLIC PANEL
ALBUMIN: 3.7 g/dL (ref 3.5–5.2)
ALT: 22 U/L (ref 0–35)
AST: 22 U/L (ref 0–37)
Alkaline Phosphatase: 49 U/L (ref 39–117)
Anion gap: 5 (ref 5–15)
BILIRUBIN TOTAL: 0.5 mg/dL (ref 0.3–1.2)
BUN: 15 mg/dL (ref 6–23)
CHLORIDE: 105 mmol/L (ref 96–112)
CO2: 27 mmol/L (ref 19–32)
CREATININE: 0.97 mg/dL (ref 0.50–1.10)
Calcium: 8.7 mg/dL (ref 8.4–10.5)
GFR calc Af Amer: 62 mL/min — ABNORMAL LOW (ref >90)
GFR, EST NON AFRICAN AMERICAN: 54 mL/min — AB (ref >90)
Glucose, Bld: 107 mg/dL — ABNORMAL HIGH (ref 70–99)
POTASSIUM: 3.9 mmol/L (ref 3.5–5.1)
Sodium: 137 mmol/L (ref 135–145)
TOTAL PROTEIN: 6 g/dL (ref 6.0–8.3)

## 2014-04-21 LAB — CBC
HEMATOCRIT: 32.2 % — AB (ref 36.0–46.0)
Hemoglobin: 10.5 g/dL — ABNORMAL LOW (ref 12.0–15.0)
MCH: 30.3 pg (ref 26.0–34.0)
MCHC: 32.6 g/dL (ref 30.0–36.0)
MCV: 93.1 fL (ref 78.0–100.0)
Platelets: 192 10*3/uL (ref 150–400)
RBC: 3.46 MIL/uL — AB (ref 3.87–5.11)
RDW: 12.5 % (ref 11.5–15.5)
WBC: 6.6 10*3/uL (ref 4.0–10.5)

## 2014-04-21 LAB — CBC WITH DIFFERENTIAL/PLATELET
BASOS ABS: 0 10*3/uL (ref 0.0–0.1)
BASOS PCT: 1 % (ref 0–1)
EOS ABS: 0.2 10*3/uL (ref 0.0–0.7)
EOS PCT: 4 % (ref 0–5)
HCT: 32.8 % — ABNORMAL LOW (ref 36.0–46.0)
HEMOGLOBIN: 10.7 g/dL — AB (ref 12.0–15.0)
Lymphocytes Relative: 33 % (ref 12–46)
Lymphs Abs: 2.1 10*3/uL (ref 0.7–4.0)
MCH: 30.3 pg (ref 26.0–34.0)
MCHC: 32.6 g/dL (ref 30.0–36.0)
MCV: 92.9 fL (ref 78.0–100.0)
MONO ABS: 0.6 10*3/uL (ref 0.1–1.0)
MONOS PCT: 9 % (ref 3–12)
Neutro Abs: 3.3 10*3/uL (ref 1.7–7.7)
Neutrophils Relative %: 53 % (ref 43–77)
Platelets: 197 10*3/uL (ref 150–400)
RBC: 3.53 MIL/uL — AB (ref 3.87–5.11)
RDW: 12.5 % (ref 11.5–15.5)
WBC: 6.2 10*3/uL (ref 4.0–10.5)

## 2014-04-21 LAB — HEPARIN LEVEL (UNFRACTIONATED)
HEPARIN UNFRACTIONATED: 0.21 [IU]/mL — AB (ref 0.30–0.70)
HEPARIN UNFRACTIONATED: 0.58 [IU]/mL (ref 0.30–0.70)

## 2014-04-21 LAB — POCT ACTIVATED CLOTTING TIME
ACTIVATED CLOTTING TIME: 196 s
Activated Clotting Time: 171 seconds

## 2014-04-21 LAB — GLUCOSE, CAPILLARY: GLUCOSE-CAPILLARY: 96 mg/dL (ref 70–99)

## 2014-04-21 LAB — MRSA PCR SCREENING: MRSA by PCR: NEGATIVE

## 2014-04-21 LAB — PROTIME-INR
INR: 1.08 (ref 0.00–1.49)
PROTHROMBIN TIME: 14.1 s (ref 11.6–15.2)

## 2014-04-21 LAB — APTT: aPTT: 53 seconds — ABNORMAL HIGH (ref 24–37)

## 2014-04-21 SURGERY — LEFT HEART CATHETERIZATION WITH CORONARY ANGIOGRAM
Anesthesia: LOCAL

## 2014-04-21 MED ORDER — FERROUS SULFATE 325 (65 FE) MG PO TABS
325.0000 mg | ORAL_TABLET | Freq: Three times a day (TID) | ORAL | Status: DC
  Administered 2014-04-21 – 2014-04-23 (×5): 325 mg via ORAL
  Filled 2014-04-21 (×8): qty 1

## 2014-04-21 MED ORDER — METOPROLOL TARTRATE 1 MG/ML IV SOLN
5.0000 mg | Freq: Once | INTRAVENOUS | Status: AC
  Administered 2014-04-21: 5 mg via INTRAVENOUS
  Filled 2014-04-21: qty 5

## 2014-04-21 MED ORDER — FAMOTIDINE IN NACL 20-0.9 MG/50ML-% IV SOLN
20.0000 mg | Freq: Once | INTRAVENOUS | Status: AC
  Administered 2014-04-21: 20 mg via INTRAVENOUS
  Filled 2014-04-21: qty 50

## 2014-04-21 MED ORDER — SODIUM CHLORIDE 0.9 % IJ SOLN: 3.0000 mL | INTRAMUSCULAR | Status: DC | PRN

## 2014-04-21 MED ORDER — FENTANYL CITRATE 0.05 MG/ML IJ SOLN
INTRAMUSCULAR | Status: AC
  Filled 2014-04-21: qty 2

## 2014-04-21 MED ORDER — PROMETHAZINE HCL 25 MG/ML IJ SOLN
12.5000 mg | Freq: Once | INTRAMUSCULAR | Status: AC
  Administered 2014-04-21: 12.5 mg via INTRAVENOUS
  Filled 2014-04-21: qty 1

## 2014-04-21 MED ORDER — ASPIRIN 81 MG PO CHEW: 81.0000 mg | CHEWABLE_TABLET | Freq: Every day | ORAL | Status: DC

## 2014-04-21 MED ORDER — SODIUM CHLORIDE 0.9 % IJ SOLN
3.0000 mL | Freq: Two times a day (BID) | INTRAMUSCULAR | Status: DC
  Administered 2014-04-21: 3 mL via INTRAVENOUS

## 2014-04-21 MED ORDER — HEPARIN (PORCINE) IN NACL 2-0.9 UNIT/ML-% IJ SOLN
INTRAMUSCULAR | Status: AC
  Filled 2014-04-21: qty 1000

## 2014-04-21 MED ORDER — SODIUM CHLORIDE 0.9 % IV SOLN
INTRAVENOUS | Status: AC
  Administered 2014-04-21: 16:00:00 via INTRAVENOUS

## 2014-04-21 MED ORDER — CLOPIDOGREL BISULFATE 75 MG PO TABS: 75.0000 mg | ORAL_TABLET | Freq: Every day | ORAL | Status: DC

## 2014-04-21 MED ORDER — MORPHINE SULFATE 2 MG/ML IJ SOLN
2.0000 mg | Freq: Once | INTRAMUSCULAR | Status: AC
  Administered 2014-04-21: 2 mg via INTRAVENOUS
  Filled 2014-04-21: qty 1

## 2014-04-21 MED ORDER — CLOPIDOGREL BISULFATE 75 MG PO TABS
ORAL_TABLET | ORAL | Status: AC
  Filled 2014-04-21: qty 1

## 2014-04-21 MED ORDER — RAMIPRIL 5 MG PO CAPS
5.0000 mg | ORAL_CAPSULE | Freq: Two times a day (BID) | ORAL | Status: DC
  Administered 2014-04-21 – 2014-04-23 (×4): 5 mg via ORAL
  Filled 2014-04-21 (×5): qty 1

## 2014-04-21 MED ORDER — ONDANSETRON HCL 4 MG/2ML IJ SOLN
4.0000 mg | Freq: Four times a day (QID) | INTRAMUSCULAR | Status: DC | PRN
Start: 2014-04-21 — End: 2014-04-21

## 2014-04-21 MED ORDER — ASPIRIN 81 MG PO CHEW: 81.0000 mg | CHEWABLE_TABLET | ORAL | Status: DC

## 2014-04-21 MED ORDER — LIDOCAINE HCL (PF) 1 % IJ SOLN
INTRAMUSCULAR | Status: AC
  Filled 2014-04-21: qty 30

## 2014-04-21 MED ORDER — ACETAMINOPHEN 325 MG PO TABS: 650.0000 mg | ORAL_TABLET | ORAL | Status: DC | PRN

## 2014-04-21 MED ORDER — BIVALIRUDIN 250 MG IV SOLR
INTRAVENOUS | Status: AC
  Filled 2014-04-21: qty 250

## 2014-04-21 MED ORDER — MIDAZOLAM HCL 2 MG/2ML IJ SOLN
INTRAMUSCULAR | Status: AC
  Filled 2014-04-21: qty 2

## 2014-04-21 MED ORDER — ROPINIROLE HCL 0.25 MG PO TABS
0.2500 mg | ORAL_TABLET | Freq: Every day | ORAL | Status: DC
  Administered 2014-04-21 – 2014-04-22 (×3): 0.25 mg via ORAL
  Filled 2014-04-21 (×4): qty 1

## 2014-04-21 MED ORDER — SODIUM CHLORIDE 0.9 % IV SOLN: 250.0000 mL | INTRAVENOUS | Status: DC | PRN

## 2014-04-21 MED ORDER — NITROGLYCERIN 1 MG/10 ML FOR IR/CATH LAB
INTRA_ARTERIAL | Status: AC
  Filled 2014-04-21: qty 10

## 2014-04-21 MED ORDER — CETYLPYRIDINIUM CHLORIDE 0.05 % MT LIQD
7.0000 mL | Freq: Two times a day (BID) | OROMUCOSAL | Status: DC
  Administered 2014-04-21 – 2014-04-23 (×6): 7 mL via OROMUCOSAL

## 2014-04-21 MED ORDER — CLONIDINE HCL 0.2 MG PO TABS
0.2000 mg | ORAL_TABLET | Freq: Two times a day (BID) | ORAL | Status: DC
  Administered 2014-04-21 – 2014-04-23 (×4): 0.2 mg via ORAL
  Filled 2014-04-21 (×5): qty 1

## 2014-04-21 MED ORDER — SODIUM CHLORIDE 0.9 % IV SOLN: 1.0000 mL/kg/h | INTRAVENOUS | Status: DC

## 2014-04-21 NOTE — Progress Notes (Signed)
Chaplain responded to "urgent cath" page. Chaplain spoke with nurse who assessed that family/pt did not require spiritual support at this time. Page if needed.  Wille GlaserMcCray, Jag Lenz O, Chaplain 04/21/2014 1:17 PM

## 2014-04-21 NOTE — H&P (View-Only) (Signed)
Subjective:  Patient denies any chest pains or shortness of breath. Complained of right arm pain earlier this morning troponin I went up to 2.47 which is trending down EKG this morning showed loss of R waves in anteroseptal leads as compared to prior EKG. Discussed with patient and family about above and agrees to proceed with left cath possible PTCA stenting.  Objective:  Vital Signs in the last 24 hours: Temp:  [97.6 F (36.4 C)-98.1 F (36.7 C)] 97.9 F (36.6 C) (02/06 0800) Pulse Rate:  [41-95] 75 (02/06 1100) Resp:  [15-25] 19 (02/06 1100) BP: (130-170)/(52-120) 138/56 mmHg (02/06 1100) SpO2:  [85 %-100 %] 98 % (02/06 1100) Weight:  [57.8 kg (127 lb 6.8 oz)-58.06 kg (128 lb)] 57.8 kg (127 lb 6.8 oz) (02/06 0000)  Intake/Output from previous day: 02/05 0701 - 02/06 0700 In: 225.4 [I.V.:225.4] Out: 100 [Urine:100] Intake/Output from this shift: Total I/O In: 120 [I.V.:120] Out: 150 [Urine:150]  Physical Exam: Neck: no adenopathy, no carotid bruit, no JVD and supple, symmetrical, trachea midline Lungs: clear to auscultation bilaterally Heart: regular rate and rhythm, S1, S2 normal and Soft systolic murmur noted Abdomen: soft, non-tender; bowel sounds normal; no masses,  no organomegaly Extremities: extremities normal, atraumatic, no cyanosis or edema  Lab Results:  Recent Labs  04/20/14 2328 04/21/14 0246  WBC 6.2 6.6  HGB 10.7* 10.5*  PLT 197 192    Recent Labs  04/20/14 1629 04/20/14 2328  NA 133* 137  K 3.7 3.9  CL 101 105  CO2 24 27  GLUCOSE 118* 107*  BUN 18 15  CREATININE 1.04 0.97    Recent Labs  04/20/14 2348 04/21/14 0246  TROPONINI 2.43* 2.08*   Hepatic Function Panel  Recent Labs  04/20/14 2328  PROT 6.0  ALBUMIN 3.7  AST 22  ALT 22  ALKPHOS 49  BILITOT 0.5   No results for input(s): CHOL in the last 72 hours. No results for input(s): PROTIME in the last 72 hours.  Imaging: Imaging results have been reviewed and Dg Chest 2  View  04/20/2014   CLINICAL DATA:  Increased shortness breath, chest pain, right-sided neck pain  EXAM: CHEST  2 VIEW  COMPARISON:  06/28/2012  FINDINGS: There is a large hiatal hernia. A portion of the colon may also be present within the thorax. The lungs are hyperinflated likely secondary to COPD. There is no focal parenchymal opacity, pleural effusion, or pneumothorax. The heart and mediastinal contours are stable.  There is a T12 vertebral body compression fracture with kyphosis centered at T12.  IMPRESSION: No active cardiopulmonary disease.  Large hiatal hernia with nearly the entire stomach within the thorax. A portion of the colon may also be present within the thorax.   Electronically Signed   By: Hetal  Patel   On: 04/20/2014 13:36    Cardiac Studies:  Assessment/Plan:  Probable recent anteroseptal wall MI Mild decompensated acute systolic failure and COPD COPD Hypertension Remote tobacco abuse Degenerative joint disease Osteoporosis with T12 compression vertebral body fracture Chronic anemia Plan Discussed with patient and her family regarding left cardiac catheterization possible PTCA stenting its risk and benefits i.e. death MI stroke need for emergency CABG local vascular complications risk of restenosis etc. and consented for PCI  LOS: 1 day    Arielys Wandersee N 04/21/2014, 11:43 AM    

## 2014-04-21 NOTE — Progress Notes (Signed)
Cardiac Cath Sheath Removal: Right femoral groin site level 0 prior to removal. Sheath removed at 2245 and manual pressure held for 20 minutes. Slight hematoma formed, massaged area to relieve hematoma, site now at level 1 with bruising but no hematoma. Bilateral dorsalis pedis pulses +2 before, during, and after sheath removal.  Patient tolerated well, vital signs stable. Post sheath removal instructions given to patient and family in room. Pressure dressing applied. Will continue to monitor.

## 2014-04-21 NOTE — Plan of Care (Signed)
Problem: Phase I Progression Outcomes Goal: Aspirin unless contraindicated Outcome: Completed/Met Date Met:  04/21/14 Aspirin given at other facility

## 2014-04-21 NOTE — Progress Notes (Signed)
ANTICOAGULATION CONSULT NOTE - Follow Up Consult  Pharmacy Consult for Heparin  Indication: chest pain/ACS  No Known Allergies  Patient Measurements: Height: 5' (152.4 cm) Weight: 127 lb 6.8 oz (57.8 kg) IBW/kg (Calculated) : 45.5  Vital Signs: Temp: 97.6 F (36.4 C) (02/06 0405) Temp Source: Oral (02/06 0405) BP: 141/52 mmHg (02/06 0405) Pulse Rate: 72 (02/06 0405)  Labs:  Recent Labs  04/20/14 1629 04/20/14 1802 04/20/14 1812 04/20/14 2328 04/20/14 2348 04/21/14 0246  HGB 11.5*  --  11.4* 10.7*  --  10.5*  HCT 36.1  --  35.4* 32.8*  --  32.2*  PLT 221  --  205 197  --  192  APTT  --  37  --  53*  --   --   LABPROT  --  13.1  --  14.1  --   --   INR  --  0.98  --  1.08  --   --   HEPARINUNFRC  --   --   --   --   --  0.21*  CREATININE 1.04  --   --  0.97  --   --   TROPONINI  --   --   --   --  2.43*  --     Estimated Creatinine Clearance: 36.8 mL/min (by C-G formula based on Cr of 0.97).  Assessment: Sub-therapeutic heparin level, no issues per RN.   Goal of Therapy:  Heparin level 0.3-0.7 units/ml Monitor platelets by anticoagulation protocol: Yes   Plan:  -Increase heparin drip to 950 units/hr -1200 HL -Daily CBC/HL -Monitor for bleeding  April Rice, April Rice 04/21/2014,4:13 AM

## 2014-04-21 NOTE — Cardiovascular Report (Signed)
NAMLiane Comber:  Husmann, Gaylyn                 ACCOUNT NO.:  [id number]  MEDICAL RECORD NO.:  [id number]  LOCATION:  2H14C                        FACILITY:  [facility]  PHYSICIAN:  Serrina Minogue N. Sharyn LullHarwani, M.D. DATE OF BIRTH:  27-Aug-1931  DATE OF PROCEDURE:  04/21/2014 DATE OF DISCHARGE:                           CARDIAC CATHETERIZATION   PROCEDURE: 1. Left cardiac cath with selective left and right coronary     angiography, LV graphy via right groin using Judkins technique. 2. Successful PTCA to mid LAD using 1.5 x 15 mm long Emerge balloon     and then 2.0 x 12 mm long Emerge balloon and then 2.75 x 12 mm long     [place] Emerge balloon for predilatation. 3. Successful deployment of 2.75 x 18 mm long Xience Alpine drug-     eluting stent in mid LAD. 4. Successful postdilatation of this stent using same 2.75 times [place]     Emerge balloon.  INDICATION FOR THE PROCEDURE:  Mr. Caryn SectionFox is an 81 year old female with past medical history significant for hypertension, COPD, remote tobacco abuse, hiatus hernia, history of hyponatremia, chronic anemia, degenerative joint disease, complains of recurrent retrosternal chest pain described as pressure, heaviness associated with shortness of breath off and on for last 2 days.  States last night the chest pressure was severe, described as heaviness, associated with shortness of breath radiating to right side of neck so decided to go to PMD's office yesterday morning and had EKG and labs drawn and was noted to have a troponin of above 3 and was advised to come to ED.  EKG done in the ER showed normal sinus rhythm with Q-waves in lead V1 and V2, and ST-T wave changes in inferolateral leads, and patient was noted to have troponin I of 1.47 which is trending down.  The patient presently denies any chest pain.  Denies nausea, vomiting, diaphoresis.  Denies palpitation, lightheadedness, or syncope.  States had similar discomfort approximately 1 week ago, associated with shortness of  breath but did not seek any medical attention.  Due to typical anginal chest pain and EKG changes and elevated cardiac enzymes, discussed with the patient and family at length regarding left cardiac cath, possible PTCA stenting, and risks and benefits, i.e., death, MI, stroke, need for emergency CABG, local vascular complications, etc. and consented for the procedure.  DESCRIPTION OF PROCEDURE:  After obtaining the informed consent, patient was brought to the cath lab and was placed on fluoroscopy table.  Right groin was prepped and draped in usual fashion.  A 1% Xylocaine was used for local anesthesia in the right groin.  With the help of thin wall needle, a 6-French arterial sheath was placed.  The sheath was aspirated and flushed.  Next, 6-French left Judkins catheter was advanced over the wire under fluoroscopic guidance up to the ascending aorta.  Wire was pulled out.  The catheter was aspirated and connected to the Manifold. Catheter was further advanced and engaged into left coronary ostium. Multiple views of the left system were taken.  Next, catheter was disengaged and was pulled out over the wire and was replaced with 6- [place] right Judkins catheter, which was advanced over the  wire under fluoroscopic guidance up to the ascending aorta.  Wire was pulled out. The catheter was aspirated and connected to the Manifold.  Catheter was further advanced and engaged into right coronary ostium.  A single view of right coronary artery was obtained.  Next, catheter was disengaged and was exchanged to 6-French pigtail catheter at the end of the procedure.  The catheter was advanced under fluoroscopic guidance up to the ascending aorta.  Catheter was further advanced across the aortic valve into the LV.  LV pressures were recorded.  Next, LV graft was done in 30-degree RAO position.  Post-angiographic pressures were recorded from LV and then pullback pressures were recorded from the aorta.   There was no gradient across the aortic valve.  Next, the pigtail catheter was pulled out over the wire.  Sheaths were aspirated and flushed.  FINDINGS:  LV showed anterolateral and apical wall hypokinesis, EF of 50% to 55%.  Left main has 10% to 15% ostial stenosis.  LAD has 15% to 20% proximal stenosis and 99% mid stenosis with haziness which is the culprit lesion of her MI and also 50% bifurcation stenosis with diagonal 2.  Diagonal 1 has mild disease.  Diagonal 2 has 50% to 60% ostial stenosis.  Left circumflex is patent.  OM1 is large which is patent.  OM2 is small which is patent.  RCA is large which has mild disease.  PDA and PLV branches were patent.  INTERVENTIONAL PROCEDURE:  Successful PTCA to mid LAD was done initially using 1.5 x 15 mm long Emerge balloon and then 2.0 x 12 mm long Emerge balloon for predilatation and then 2.75 x 12 mm long [place] Emerge balloon for predilatation going up to 8 atmospheric pressure and then 2.75 x 18 mm long Xience Alpine drug-eluting stent was deployed in mid LAD at 11 atmospheric pressure.  The stent was post dilated using the same 2.75 x 12 mm long [place] Emerge balloon going up to 15 to 18 atmospheric pressure. Lesion dilated from 99% to 0% residual with excellent TIMI grade 3 distal flow without evidence of dissection or distal embolization. Patient received weight based Angiomax, 300 mg more of Plavix during the procedure.  The patient tolerated the procedure well.  There were no complications.  Patient was transferred to recovery room in stable condition.     [name]. Sharyn Lull, M.D.     [facility]  D:  04/21/2014  T:  04/21/2014  Job:  [id number]

## 2014-04-21 NOTE — CV Procedure (Signed)
Left cardiac as/PTCA stenting report dictated on 04/21/2014 dictation number is [id number]

## 2014-04-21 NOTE — Progress Notes (Signed)
Subjective:  Patient denies any chest pains or shortness of breath. Complained of right arm pain earlier this morning troponin I went up to 2.47 which is trending down EKG this morning showed loss of R waves in anteroseptal leads as compared to prior EKG. Discussed with patient and family about above and agrees to proceed with left cath possible PTCA stenting.  Objective:  Vital Signs in the last 24 hours: Temp:  [97.6 F (36.4 C)-98.1 F (36.7 C)] 97.9 F (36.6 C) (02/06 0800) Pulse Rate:  [41-95] 75 (02/06 1100) Resp:  [15-25] 19 (02/06 1100) BP: (130-170)/(52-120) 138/56 mmHg (02/06 1100) SpO2:  [85 %-100 %] 98 % (02/06 1100) Weight:  [57.8 kg (127 lb 6.8 oz)-58.06 kg (128 lb)] 57.8 kg (127 lb 6.8 oz) (02/06 0000)  Intake/Output from previous day: 02/05 0701 - 02/06 0700 In: 225.4 [I.V.:225.4] Out: 100 [Urine:100] Intake/Output from this shift: Total I/O In: 120 [I.V.:120] Out: 150 [Urine:150]  Physical Exam: Neck: no adenopathy, no carotid bruit, no JVD and supple, symmetrical, trachea midline Lungs: clear to auscultation bilaterally Heart: regular rate and rhythm, S1, S2 normal and Soft systolic murmur noted Abdomen: soft, non-tender; bowel sounds normal; no masses,  no organomegaly Extremities: extremities normal, atraumatic, no cyanosis or edema  Lab Results:  Recent Labs  04/20/14 2328 04/21/14 0246  WBC 6.2 6.6  HGB 10.7* 10.5*  PLT 197 192    Recent Labs  04/20/14 1629 04/20/14 2328  NA 133* 137  K 3.7 3.9  CL 101 105  CO2 24 27  GLUCOSE 118* 107*  BUN 18 15  CREATININE 1.04 0.97    Recent Labs  04/20/14 2348 04/21/14 0246  TROPONINI 2.43* 2.08*   Hepatic Function Panel  Recent Labs  04/20/14 2328  PROT 6.0  ALBUMIN 3.7  AST 22  ALT 22  ALKPHOS 49  BILITOT 0.5   No results for input(s): CHOL in the last 72 hours. No results for input(s): PROTIME in the last 72 hours.  Imaging: Imaging results have been reviewed and Dg Chest 2  View  04/20/2014   CLINICAL DATA:  Increased shortness breath, chest pain, right-sided neck pain  EXAM: CHEST  2 VIEW  COMPARISON:  06/28/2012  FINDINGS: There is a large hiatal hernia. A portion of the colon may also be present within the thorax. The lungs are hyperinflated likely secondary to COPD. There is no focal parenchymal opacity, pleural effusion, or pneumothorax. The heart and mediastinal contours are stable.  There is a T12 vertebral body compression fracture with kyphosis centered at T12.  IMPRESSION: No active cardiopulmonary disease.  Large hiatal hernia with nearly the entire stomach within the thorax. A portion of the colon may also be present within the thorax.   Electronically Signed   By: Elige KoHetal  Patel   On: 04/20/2014 13:36    Cardiac Studies:  Assessment/Plan:  Probable recent anteroseptal wall MI Mild decompensated acute systolic failure and COPD COPD Hypertension Remote tobacco abuse Degenerative joint disease Osteoporosis with T12 compression vertebral body fracture Chronic anemia Plan Discussed with patient and her family regarding left cardiac catheterization possible PTCA stenting its risk and benefits i.e. death MI stroke need for emergency CABG local vascular complications risk of restenosis etc. and consented for PCI  LOS: 1 day    April Rice N 04/21/2014, 11:43 AM

## 2014-04-21 NOTE — Progress Notes (Signed)
CRITICAL VALUE ALERT  Critical value received:  Troponin 2.43  Date of notification: 04/21/14  Time of notification:  0200  Critical value read back: yes  Nurse who received alert:  Leda GauzeMariah Darnella Zeiter  MD notified (1st page):  Harwani  Time of first page:  0215  Responding MD: Sharyn LullHarwani  Time MD responded:  [phone]

## 2014-04-21 NOTE — Interval H&P Note (Signed)
Cath Lab Visit (complete for each Cath Lab visit)  Clinical Evaluation Leading to the Procedure:   ACS: Yes.    Non-ACS:    Anginal Classification: CCS IV  Anti-ischemic medical therapy: Maximal Therapy (2 or more classes of medications)  Non-Invasive Test Results: No non-invasive testing performed  Prior CABG: No previous CABG      History and Physical Interval Note:  04/21/2014 1:29 PM  April Rice  has presented today for surgery, with the diagnosis of non stemi  The various methods of treatment have been discussed with the patient and family. After consideration of risks, benefits and other options for treatment, the patient has consented to  Procedure(s): LEFT HEART CATHETERIZATION WITH CORONARY ANGIOGRAM (N/A) as a surgical intervention .  The patient's history has been reviewed, patient examined, no change in status, stable for surgery.  I have reviewed the patient's chart and labs.  Questions were answered to the patient's satisfaction.     Robynn PaneHARWANI,Rolen Conger N

## 2014-04-21 NOTE — Progress Notes (Signed)
Subjective:  Patient denies any chest pain or shortness of breath. Tolerated left cardiac cath/PTCA stenting to mid LAD well with excellent results.  Objective:  Vital Signs in the last 24 hours: Temp:  [97.6 F (36.4 C)-98.1 F (36.7 C)] 97.8 F (36.6 C) (02/06 1200) Pulse Rate:  [41-95] 79 (02/06 1328) Resp:  [15-25] 24 (02/06 1300) BP: (130-170)/(52-120) 154/83 mmHg (02/06 1300) SpO2:  [85 %-100 %] 92 % (02/06 1300) Weight:  [57.5 kg (126 lb 12.2 oz)-58.06 kg (128 lb)] 57.5 kg (126 lb 12.2 oz) (02/06 1155)  Intake/Output from previous day: 02/05 0701 - 02/06 0700 In: 225.4 [I.V.:225.4] Out: 100 [Urine:100] Intake/Output from this shift: Total I/O In: 180 [I.V.:180] Out: 400 [Urine:400]  Physical Exam: Neck: no adenopathy, no carotid bruit, no JVD and supple, symmetrical, trachea midline Lungs: Decreased breath sound at bases Heart: regular rate and rhythm, S1, S2 normal and Soft systolic murmur noted Abdomen: soft, non-tender; bowel sounds normal; no masses,  no organomegaly Extremities: extremities normal, atraumatic, no cyanosis or edema and Right groin stable  Lab Results:  Recent Labs  04/20/14 2328 04/21/14 0246  WBC 6.2 6.6  HGB 10.7* 10.5*  PLT 197 192    Recent Labs  04/20/14 1629 04/20/14 2328  NA 133* 137  K 3.7 3.9  CL 101 105  CO2 24 27  GLUCOSE 118* 107*  BUN 18 15  CREATININE 1.04 0.97    Recent Labs  04/21/14 0246 04/21/14 1143  TROPONINI 2.08* 1.81*   Hepatic Function Panel  Recent Labs  04/20/14 2328  PROT 6.0  ALBUMIN 3.7  AST 22  ALT 22  ALKPHOS 49  BILITOT 0.5   No results for input(s): CHOL in the last 72 hours. No results for input(s): PROTIME in the last 72 hours.  Imaging: Imaging results have been reviewed and Dg Chest 2 View  04/20/2014   CLINICAL DATA:  Increased shortness breath, chest pain, right-sided neck pain  EXAM: CHEST  2 VIEW  COMPARISON:  06/28/2012  FINDINGS: There is a large hiatal hernia. A portion  of the colon may also be present within the thorax. The lungs are hyperinflated likely secondary to COPD. There is no focal parenchymal opacity, pleural effusion, or pneumothorax. The heart and mediastinal contours are stable.  There is a T12 vertebral body compression fracture with kyphosis centered at T12.  IMPRESSION: No active cardiopulmonary disease.  Large hiatal hernia with nearly the entire stomach within the thorax. A portion of the colon may also be present within the thorax.   Electronically Signed   By: Elige KoHetal  Patel   On: 04/20/2014 13:36    Cardiac Studies:  Assessment/Plan:  Probable recent anteroseptal wall MI status post left cath/PTCA stenting to mid LAD with excellent results compensated acute systolic failure  COPD Hypertension Remote tobacco abuse Degenerative joint disease Osteoporosis with T12 compression vertebral body fracture Chronic anemia Plan Check labs in a.m. Continue present management  LOS: 1 day    Jamine Highfill N 04/21/2014, 4:23 PM

## 2014-04-22 ENCOUNTER — Encounter (HOSPITAL_COMMUNITY): Payer: Self-pay

## 2014-04-22 LAB — BASIC METABOLIC PANEL
Anion gap: 7 (ref 5–15)
BUN: 9 mg/dL (ref 6–23)
CO2: 24 mmol/L (ref 19–32)
CREATININE: 0.78 mg/dL (ref 0.50–1.10)
Calcium: 8.4 mg/dL (ref 8.4–10.5)
Chloride: 104 mmol/L (ref 96–112)
GFR calc Af Amer: 89 mL/min — ABNORMAL LOW (ref >90)
GFR, EST NON AFRICAN AMERICAN: 77 mL/min — AB (ref >90)
Glucose, Bld: 88 mg/dL (ref 70–99)
POTASSIUM: 4.2 mmol/L (ref 3.5–5.1)
SODIUM: 135 mmol/L (ref 135–145)

## 2014-04-22 LAB — CBC
HCT: 28.5 % — ABNORMAL LOW (ref 36.0–46.0)
Hemoglobin: 9.4 g/dL — ABNORMAL LOW (ref 12.0–15.0)
MCH: 30.6 pg (ref 26.0–34.0)
MCHC: 33 g/dL (ref 30.0–36.0)
MCV: 92.8 fL (ref 78.0–100.0)
PLATELETS: 172 10*3/uL (ref 150–400)
RBC: 3.07 MIL/uL — ABNORMAL LOW (ref 3.87–5.11)
RDW: 12.5 % (ref 11.5–15.5)
WBC: 5.9 10*3/uL (ref 4.0–10.5)

## 2014-04-22 LAB — TROPONIN I: Troponin I: 1.22 ng/mL (ref <0.031)

## 2014-04-22 MED ORDER — METOPROLOL TARTRATE 25 MG PO TABS
25.0000 mg | ORAL_TABLET | Freq: Two times a day (BID) | ORAL | Status: DC
  Administered 2014-04-22 – 2014-04-23 (×2): 25 mg via ORAL
  Filled 2014-04-22 (×3): qty 1

## 2014-04-22 NOTE — Plan of Care (Signed)
Problem: Phase III Progression Outcomes Goal: If positive for MI, change to MI Path Outcome: Not Met (add Reason) Was taken to cath lab 1st

## 2014-04-22 NOTE — Plan of Care (Signed)
Problem: Phase III Progression Outcomes Goal: Vascular site scale level 0 - I Vascular Site Scale Level 0: No bruising/bleeding/hematoma Level I (Mild): Bruising/Ecchymosis, minimal bleeding/ooozing, palpable hematoma < 3 cm Level II (Moderate): Bleeding not affecting hemodynamic parameters, pseudoaneurysm, palpable hematoma > 3 cm Level III (Severe) Bleeding which affects hemodynamic parameters or retroperitoneal hemorrhage  Outcome: Completed/Met Date Met:  04/22/14 Level 1

## 2014-04-22 NOTE — Progress Notes (Signed)
Subjective:    Doing well no anginal chest pain or shortness of breath.  Objective:  Vital Signs in the last 24 hours: Temp:  [97.6 F (36.4 C)-98.7 F (37.1 C)] 98.7 F (37.1 C) (02/07 0800) Pulse Rate:  [68-87] 77 (02/07 0800) Resp:  [13-35] 17 (02/07 0900) BP: (91-188)/(43-131) 153/71 mmHg (02/07 0900) SpO2:  [91 %-100 %] 97 % (02/07 0800) Weight:  [57.5 kg (126 lb 12.2 oz)] 57.5 kg (126 lb 12.2 oz) (02/06 1155)  Intake/Output from previous day: 02/06 0701 - 02/07 0700 In: 1046.2 [I.V.:1046.2] Out: 2200 [Urine:2200] Intake/Output from this shift: Total I/O In: 360 [P.O.:360] Out: -   Physical Exam: Neck: no adenopathy, no carotid bruit, no JVD and supple, symmetrical, trachea midline Lungs: clear to auscultation bilaterally Heart: regular rate and rhythm, S1, S2 normal and  soft systolic murmur noted no S3 gallop Abdomen: soft, non-tender; bowel sounds normal; no masses,  no organomegaly Extremities: extremities normal, atraumatic, no cyanosis or edema and  right groin mild ecchymosis noted no hematoma dressing dry  Lab Results:  Recent Labs  04/21/14 0246 04/22/14 0321  WBC 6.6 5.9  HGB 10.5* 9.4*  PLT 192 172    Recent Labs  04/20/14 2328 04/22/14 0321  NA 137 135  K 3.9 4.2  CL 105 104  CO2 27 24  GLUCOSE 107* 88  BUN 15 9  CREATININE 0.97 0.78    Recent Labs  04/21/14 1820 04/22/14 0321  TROPONINI 1.89* 1.22*   Hepatic Function Panel  Recent Labs  04/20/14 2328  PROT 6.0  ALBUMIN 3.7  AST 22  ALT 22  ALKPHOS 49  BILITOT 0.5   No results for input(s): CHOL in the last 72 hours. No results for input(s): PROTIME in the last 72 hours.  Imaging: Imaging results have been reviewed and Dg Chest 2 View  04/20/2014   CLINICAL DATA:  Increased shortness breath, chest pain, right-sided neck pain  EXAM: CHEST  2 VIEW  COMPARISON:  06/28/2012  FINDINGS: There is a large hiatal hernia. A portion of the colon may also be present within the thorax.  The lungs are hyperinflated likely secondary to COPD. There is no focal parenchymal opacity, pleural effusion, or pneumothorax. The heart and mediastinal contours are stable.  There is a T12 vertebral body compression fracture with kyphosis centered at T12.  IMPRESSION: No active cardiopulmonary disease.  Large hiatal hernia with nearly the entire stomach within the thorax. A portion of the colon may also be present within the thorax.   Electronically Signed   By: Elige KoHetal  Patel   On: 04/20/2014 13:36    Cardiac Studies:  Assessment/Plan:  Recent anteroseptal wall MI status post left cath/PTCA stenting to mid LAD with excellent results compensated acute systolic failure  COPD Hypertension Remote tobacco abuse Degenerative joint disease Osteoporosis with T12 compression vertebral body fracture   Acute onChronic anemia  Secondary to blood loss during the procedure and hydration  plan  continue present management Phase I cardiac rehabilitation  check labs in a.m.   possible discharge in a.m. If stable Plan  LOS: 2 days    April Rice N 04/22/2014, 10:03 AM

## 2014-04-23 LAB — CBC
HCT: 27.5 % — ABNORMAL LOW (ref 36.0–46.0)
Hemoglobin: 9.1 g/dL — ABNORMAL LOW (ref 12.0–15.0)
MCH: 31.2 pg (ref 26.0–34.0)
MCHC: 33.1 g/dL (ref 30.0–36.0)
MCV: 94.2 fL (ref 78.0–100.0)
Platelets: 155 10*3/uL (ref 150–400)
RBC: 2.92 MIL/uL — ABNORMAL LOW (ref 3.87–5.11)
RDW: 12.5 % (ref 11.5–15.5)
WBC: 5.5 10*3/uL (ref 4.0–10.5)

## 2014-04-23 LAB — BASIC METABOLIC PANEL
Anion gap: 4 — ABNORMAL LOW (ref 5–15)
BUN: 13 mg/dL (ref 6–23)
CALCIUM: 8.7 mg/dL (ref 8.4–10.5)
CO2: 29 mmol/L (ref 19–32)
Chloride: 102 mmol/L (ref 96–112)
Creatinine, Ser: 1.01 mg/dL (ref 0.50–1.10)
GFR calc non Af Amer: 51 mL/min — ABNORMAL LOW (ref >90)
GFR, EST AFRICAN AMERICAN: 59 mL/min — AB (ref >90)
Glucose, Bld: 101 mg/dL — ABNORMAL HIGH (ref 70–99)
Potassium: 4.3 mmol/L (ref 3.5–5.1)
Sodium: 135 mmol/L (ref 135–145)

## 2014-04-23 LAB — HEMOGLOBIN A1C
HEMOGLOBIN A1C: 6 % — AB (ref 4.8–5.6)
MEAN PLASMA GLUCOSE: 126 mg/dL

## 2014-04-23 LAB — TROPONIN I: Troponin I: 1.06 ng/mL (ref <0.031)

## 2014-04-23 LAB — POCT ACTIVATED CLOTTING TIME: ACTIVATED CLOTTING TIME: 380 s

## 2014-04-23 MED ORDER — FERROUS SULFATE 325 (65 FE) MG PO TABS: 325.0000 mg | ORAL_TABLET | Freq: Three times a day (TID) | ORAL | Status: DC

## 2014-04-23 MED ORDER — NITROGLYCERIN 0.4 MG SL SUBL: 0.4000 mg | SUBLINGUAL_TABLET | SUBLINGUAL | Status: AC | PRN

## 2014-04-23 MED ORDER — RAMIPRIL 10 MG PO CAPS: 10.0000 mg | ORAL_CAPSULE | Freq: Every day | ORAL | Status: AC

## 2014-04-23 MED ORDER — CLOPIDOGREL BISULFATE 75 MG PO TABS: 75.0000 mg | ORAL_TABLET | Freq: Every day | ORAL | Status: AC

## 2014-04-23 MED ORDER — ATORVASTATIN CALCIUM 40 MG PO TABS: 40.0000 mg | ORAL_TABLET | Freq: Every day | ORAL | Status: AC

## 2014-04-23 MED ORDER — ASPIRIN 81 MG PO TBEC: 81.0000 mg | DELAYED_RELEASE_TABLET | Freq: Every day | ORAL | Status: AC

## 2014-04-23 MED ORDER — CLONIDINE HCL 0.1 MG PO TABS: 0.1000 mg | ORAL_TABLET | Freq: Two times a day (BID) | ORAL | Status: DC

## 2014-04-23 MED ORDER — METOPROLOL SUCCINATE ER 100 MG PO TB24: 100.0000 mg | ORAL_TABLET | Freq: Every day | ORAL | Status: AC

## 2014-04-23 MED ORDER — PANTOPRAZOLE SODIUM 40 MG PO TBEC: 40.0000 mg | DELAYED_RELEASE_TABLET | Freq: Every day | ORAL | Status: DC

## 2014-04-23 MED FILL — Sodium Chloride IV Soln 0.9%: INTRAVENOUS | Qty: 50 | Status: AC

## 2014-04-23 NOTE — Discharge Instructions (Signed)
° °  ° °Acute Coronary Syndrome °Acute coronary syndrome (ACS) is an urgent problem in which the blood and oxygen supply to the heart is critically deficient. ACS requires hospitalization because one or more coronary arteries may be blocked. °ACS represents a range of conditions including: °· Previous angina that is now unstable, lasts longer, happens at rest, or is more intense. °· A heart attack, with heart muscle cell injury and death. °There are three vital coronary arteries that supply the heart muscle with blood and oxygen so that it can pump blood effectively. If blockages to these arteries develop, blood flow to the heart muscle is reduced. If the heart does not get enough blood, angina may occur as the first warning sign. °SYMPTOMS  °· The most common signs of angina include: °· Tightness or squeezing in the chest. °· Feeling of heaviness on the chest. °· Discomfort in the arms, neck, back, or jaw. °· Shortness of breath and nausea. °· Cold, wet skin. °· Angina is usually brought on by physical effort or excitement which increase the oxygen needs of the heart. These states increase the blood flow needs of the heart beyond what can be delivered. °· Other symptoms that are not as common include: °· Fatigue °· Unexplained feelings of nervousness or anxiety °· Weakness °· Diarrhea °· Sometimes, you may not have noticed any symptoms at all but still suffered a cardiac injury. °TREATMENT  °· Medicines to help discomfort may include nitroglycerin (nitro) in the form of tablets or a spray for rapid relief, or longer-acting forms such as cream, patches, or capsules. (Be aware that there are many side effects and possible interactions with other drugs). °· Other medicines may be used to help the heart pump better. °· Procedures to open blocked arteries including angioplasty or stent placement to keep the arteries open. °· Open heart surgery may be needed when there are many blockages or they are in critical  locations that are best treated with surgery. °HOME CARE INSTRUCTIONS  °· Do not use any tobacco products including cigarettes, chewing tobacco, or electronic cigarettes. °· Take one baby or adult aspirin daily, if your health care provider advises. This helps reduce the risk of a heart attack. °· It is very important that you follow the angina treatment prescribed by your health care provider. Make arrangements for proper follow-up care. °· Eat a heart healthy diet with salt and fat restrictions as advised. °· Regular exercise is good for you as long as it does not cause discomfort. Do not begin any new type of exercise until you check with your health care provider. °· If you are overweight, you should lose weight. °· Try to maintain normal blood lipid levels. °· Keep your blood pressure under control as recommended by your health care provider. °· You should tell your health care provider right away about any increase in the severity or frequency of your chest discomfort or angina attacks. When you have angina, you should stop what you are doing and sit down. This may bring relief in 3 to 5 minutes. If your health care provider has prescribed nitro, take it as directed. °· If your health care provider has given you a follow-up appointment, it is very important to keep that appointment. Not keeping the appointment could result in a chronic or permanent injury, pain, and disability. If there is any problem keeping the appointment, you must call back to this facility for assistance. °SEEK IMMEDIATE MEDICAL CARE IF:  °· You develop   nausea, vomiting, or shortness of breath. °· You feel faint, lightheaded, or pass out. °· Your chest discomfort gets worse. °· You are sweating or experience sudden profound fatigue. °· You do not get relief of your chest pain after 3 doses of nitro. °· Your discomfort lasts longer than 15 minutes. °MAKE SURE YOU:  °· Understand these instructions. °· Will watch your condition. °· Will get  help right away if you are not doing well or get worse. °· Take all medicines as directed by your health care provider. °Document Released: 03/02/2005 Document Revised: 03/07/2013 Document Reviewed: 07/04/2013 °ExitCare® Patient Information ©2015 [employer]. This information is not intended to replace advice given to you by your health care provider. Make sure you discuss any questions you have with your health care provider. °Coronary Angiogram with Stent °Coronary angiography with stent placement is a procedure to widen or open a narrow blood vessel of the heart (coronary artery). When a coronary artery becomes partially blocked, it decreases blood flow to that area. This may lead to chest pain or a heart attack (myocardial infarction). Arteries may become blocked by cholesterol buildup (plaque) in the lining or wall.  °A stent is a small piece of metal that looks like a mesh or a spring. Stent placement may be done right after a coronary angiography in which a blocked artery is found or as a treatment for a heart attack.  °LET YOUR HEALTH CARE PROVIDER KNOW ABOUT: °· Any allergies you have.   °· All medicines you are taking, including vitamins, herbs, eye drops, creams, and over-the-counter medicines.   °· Previous problems you or members of your family have had with the use of anesthetics.   °· Any blood disorders you have.   °· Previous surgeries you have had.   °· Medical conditions you have. °RISKS AND COMPLICATIONS °Generally, coronary angiography with stent is a safe procedure. However, problems can occur and include: °· Damage to the heart or its blood vessels.   °· A return of blockage.   °· Bleeding, infection, or bruising at the insertion site.   °· A collection of blood under the skin (hematoma) at the insertion site. °· Blood clot in another part of the body.   °· Kidney injury.   °· Allergic reaction to the dye or contrast used.   °· Bleeding into the abdomen (retroperitoneal bleeding). °BEFORE  THE PROCEDURE °· Do not eat or drink anything after midnight on the night before the procedure or as directed by your health care provider.  °· Ask your health care provider about changing or stopping your regular medicines. This is especially important if you are taking diabetes medicines or blood thinners. °· Your health care provider will make sure you understand the procedure as well as the risks and potential problems associated with the procedure.   °PROCEDURE °· You may be given a medicine to help you relax before and during the procedure (sedative). This medicine will be given through an IV tube that is put into one of your veins.   °· The area where the catheter will be inserted will be shaved and cleaned. This is usually done in the groin but may be done in the fold of your arm (near your elbow) or in the wrist.    °· A medicine will be given to numb the area where the catheter will be inserted (local anesthetic).   °· The catheter will be inserted into an artery using a guide wire. A type of X-ray (fluoroscopy) will be used to help guide the catheter to the opening of the blocked   artery.   °· A dye will then be injected into the catheter, and X-rays will be taken. The dye will help to show where any narrowing or blockages are located in the heart arteries.   °· A tiny wire will be guided to the blocked spot, and a balloon will be inflated to make the artery wider. The stent will be expanded and will crush the plaque into the wall of the vessel. The stent will hold the area open like a scaffolding and improve the blood flow.   °· Sometimes the artery may be made wider using a laser or other tools to remove plaque.   °· When the blood flow is better, the catheter will be removed. The lining of the artery will grow over the stent, which stays where it was placed.   °AFTER THE PROCEDURE °· If the procedure is done through the leg, you will be kept in bed lying flat for about 6 hours. You will be instructed to  not bend or cross your legs.   °· The insertion site will be checked frequently.   °· The pulse in your feet or wrist will be checked frequently.   °· Additional blood tests, X-rays, and electrocardiography may be done. °Document Released: 09/06/2002 Document Revised: 07/17/2013 Document Reviewed: 09/08/2012 °ExitCare® Patient Information ©2015 [employer]. This information is not intended to replace advice given to you by your health care provider. Make sure you discuss any questions you have with your health care provider. ° °

## 2014-04-23 NOTE — Progress Notes (Signed)
CARDIAC REHAB PHASE I   PRE:  Rate/Rhythm: 88 SR    BP: sitting 121/71    SaO2: 90-93 RA  MODE:  Ambulation: 350 ft   POST:  Rate/Rhythm: 108 ST    BP: sitting 187/82     SaO2: 93 RA  Pt gets more SOB with activity. Does not have portable O2 tank, only at night. SaO2 monitored during walk. SaO2 would drop to 80s but was not reading well. When pt stopped and rested, SaO2 immediately up to 93-95 RA. Walked pt x2 to make sure. Family then stated that they do have portable O2 tank at home, it is just not filled and that they pay for her night O2. Ed completed. Not interested in CRPII. Pt not very interested in walking at home either. Encouraged her to work on breathing techniques when she walks and rest when she is winded. [id number]  April Rice, April Rice CES, ACSM 04/23/2014 10:08 AM

## 2014-04-23 NOTE — Discharge Summary (Signed)
Discharge summary dictated on 04/23/2014 dictation number is [phone]

## 2014-04-23 NOTE — Progress Notes (Signed)
Reviewed AVS with Patient and her grand daughter using teachback method. Pt and caretaker deny questions or complaints at this time. Pt sent home with her belongings including upper and lower dentures, clothing, and shoes. She was taken to a privated vehicle via wheelchair.  Dawson BillsKim Allani Reber, RN

## 2014-04-23 NOTE — Care Management Note (Signed)
    Page 1 of 1   04/23/2014     9:26:18 AM CARE MANAGEMENT NOTE 04/23/2014  Patient:  April Rice,April Rice   Account Number:  [id number]  Date Initiated:  04/23/2014  Documentation initiated by:  Junius CreamerWELL,DEBBIE  Subjective/Objective Assessment:   adm w mi     Action/Plan:   lives w fam, pcp dr Blima Richstephani alm   Anticipated DC Date:     Anticipated DC Plan:  HOME/SELF CARE         Choice offered to / List presented to:             Status of service:   Medicare Important Message given?  YES (If response is "NO", the following Medicare IM given date fields will be blank) Date Medicare IM given:  04/23/2014 Medicare IM given by:  Junius CreamerWELL,DEBBIE Date Additional Medicare IM given:   Additional Medicare IM given by:    Discharge Disposition:    Per UR Regulation:  Reviewed for med. necessity/level of care/duration of stay  If discussed at Long Length of Stay Meetings, dates discussed:    Comments:

## 2014-04-24 NOTE — Discharge Summary (Signed)
NAMLiane Comber:  Forsberg, [name]                 ACCOUNT NO.:  [id number]  MEDICAL RECORD NO.:  [id number]  LOCATION:  2H14C                        FACILITY:  [facility]  PHYSICIAN:  Erminia Mcnew N. Sharyn LullHarwani, M.D. DATE OF BIRTH:  September 01, 1931  DATE OF ADMISSION:  04/20/2014 DATE OF DISCHARGE:  04/23/2014                              DISCHARGE SUMMARY   ADMITTING DIAGNOSES: 1. Probable recent anteroseptal wall myocardial infarction. 2. Mild decompensated acute systolic heart failure. 3. Chronic obstructive pulmonary disease. 4. Hypertension. 5. Remote tobacco abuse. 6. Degenerative joint disease. 7. Osteoporosis with T12 compression vertebral body compression     fracture in the past. 8. Chronic anemia, elevated blood sugar, rule out diabetes mellitus.  DISCHARGE DIAGNOSES: 1. Status post recent anteroseptal wall myocardial infarction, status     post left cardiac cath/PTCA stenting to mid LAD with excellent     results. 2. Compensated systolic heart failure. 3. Chronic obstructive pulmonary disease. 4. Hypertension. 5. Remote tobacco abuse. 6. Degenerative joint disease. 7. Osteoporosis with T12 vertebral body compression fracture in the     past. 8. Acute on chronic anemia secondary to blood loss during the     procedure and hydration which is stable. 9. Prediabetic. 10.Hypercholesteremia.  DISCHARGE HOME MEDICATIONS: 1. Aspirin 81 mg 1 tablet daily. 2. Atorvastatin 40 mg 1 tablet daily. 3. Clopidogrel 75 mg 1 tablet daily. 4. Ferrous sulfate 325 mg 1 tablets 3 times daily. 5. Metoprolol succinate 100 mg daily. 6. Nitrostat 0.4 mg sublingual p.r.n. 7. Protonix 40 mg daily. 8. Ramipril 10 mg 1 capsule daily. 9. Albuterol inhaler 1 puff every 6 hours as needed. 10.Biofreeze EX 1 application topically as needed for pain. 11.Citalopram 40 mg daily as before. 12.Tylenol PM 1 tablet at bedtime as needed. 13.Clonidine dose has been reduced to 0.1 mg twice daily.  The patient has been advised to  stop meloxicam, Bystolic, Benicar, Prilosec, and simvastatin.  DIET:  Low salt, low cholesterol, 1800 calories ADA diet.  FOLLOWUP:  Follow up with me in 1 week.  Follow up with PMD as scheduled.  CONDITION AT DISCHARGE:  Stable.  Post cardiac cath and PTCA stent instructions have been given.  The patient will be scheduled for phase 2 cardiac rehab as an outpatient.  BRIEF HISTORY AND HOSPITAL COURSE:  April Rice is an 81 year old female with past medical history significant for hypertension, COPD, remote tobacco abuse, hiatus hernia, history of hyponatremia, chronic anemia, degenerative joint disease, complains of recurrent retrosternal chest pressure described as heaviness pressure associated with shortness of breath off and on for last 2 days.  States last night her pressure was severe described as heaviness associated with shortness of breath, radiating to the right side of the neck, so decided to go to PMD's office in the morning and had EKG and labs drawn which were abnormal with elevated troponin I above 3 and was advised to go to the ED.  EKG done in the ER showed normal sinus rhythm with Q-waves in lead V1, V2, and ST-T wave changes in anterolateral leads and was noted to have a troponin I of 1.47 which is trending down.  The patient presently denies any chest pain.  Denies nausea,  vomiting, diaphoresis.  Denies palpitation, lightheadedness, or syncope.  States she had similar discomfort approximately 1 week ago with shortness of breath but did not seek any medical attention.  PHYSICAL EXAMINATION:  VITAL SIGNS:  Her blood pressure was 163/72, pulse was 82.  She was afebrile. HEENT:  Conjunctivae were pink. NECK:  Supple.  No JVD.  No bruit. LUNGS:  Decreased breath sounds at bases with faint rales. CARDIOVASCULAR:  S1, S2 was normal.  There was soft systolic murmur and S3 gallop. ABDOMEN:  Soft.  Bowel sounds were present.  Nontender. EXTREMITIES:  There is no clubbing,  cyanosis, or edema.  LABORATORY DATA:  Sodium was 133, potassium 3.7, BUN was 18, creatinine 1.04.  Glucose was 118.  Her BNP was 1037.  Hemoglobin was 11.5, hematocrit 36.1.  Troponin I was 1.47, repeat troponin I was 2.43.  Next troponin 2.08, post PCI it was 1.81, 1.22, and today troponin I is 1.06 which is trending down.  Her labs today, sodium is 135, potassium 4.3, BUN 13, creatinine 1.01, hemoglobin is 9.1, hematocrit is 27.5, which is stable since yesterday.  BRIEF HOSPITAL COURSE:  The patient was admitted to CCU, was transferred to [facility], was started on IV heparin, nitrates, beta- blockers, and clopidogrel.  The patient during the hospital stay again had some chest pain and neck pain, requiring cardiac catheterization and PTCA stenting to mid LAD with excellent results.  Postprocedure, patient did not have any anginal chest pain.  Phase 1 cardiac rehab was called. The patient has been ambulating in room and hallway without any problems.  Her groin has mild ecchymosis.  No evidence of hematoma.  The patient will be discharged home on above medications and will be followed up in my office in 1 week.  The patient will be scheduled for phase 2 cardiac rehab as outpatient.     [name]. Sharyn Lull, M.D.     [facility]/MEDQ  D:  04/23/2014  T:  04/24/2014  Job:  [id number]

## 2014-05-01 DIAGNOSIS — J449 Chronic obstructive pulmonary disease, unspecified: Secondary | ICD-10-CM | POA: Diagnosis not present

## 2014-05-01 DIAGNOSIS — D649 Anemia, unspecified: Secondary | ICD-10-CM | POA: Diagnosis not present

## 2014-05-01 DIAGNOSIS — E785 Hyperlipidemia, unspecified: Secondary | ICD-10-CM | POA: Diagnosis not present

## 2014-05-01 DIAGNOSIS — I2510 Atherosclerotic heart disease of native coronary artery without angina pectoris: Secondary | ICD-10-CM | POA: Diagnosis not present

## 2014-05-01 DIAGNOSIS — I509 Heart failure, unspecified: Secondary | ICD-10-CM | POA: Diagnosis not present

## 2014-05-01 DIAGNOSIS — I2109 ST elevation (STEMI) myocardial infarction involving other coronary artery of anterior wall: Secondary | ICD-10-CM | POA: Diagnosis not present

## 2014-05-01 DIAGNOSIS — I10 Essential (primary) hypertension: Secondary | ICD-10-CM | POA: Diagnosis not present

## 2014-05-01 DIAGNOSIS — M1990 Unspecified osteoarthritis, unspecified site: Secondary | ICD-10-CM | POA: Diagnosis not present

## 2014-05-02 DIAGNOSIS — J449 Chronic obstructive pulmonary disease, unspecified: Secondary | ICD-10-CM | POA: Diagnosis not present

## 2014-05-02 DIAGNOSIS — I2510 Atherosclerotic heart disease of native coronary artery without angina pectoris: Secondary | ICD-10-CM | POA: Diagnosis not present

## 2014-05-02 DIAGNOSIS — D649 Anemia, unspecified: Secondary | ICD-10-CM | POA: Diagnosis not present

## 2014-05-02 DIAGNOSIS — I10 Essential (primary) hypertension: Secondary | ICD-10-CM | POA: Diagnosis not present

## 2014-05-02 DIAGNOSIS — Z09 Encounter for follow-up examination after completed treatment for conditions other than malignant neoplasm: Secondary | ICD-10-CM | POA: Diagnosis not present

## 2014-05-04 DIAGNOSIS — I252 Old myocardial infarction: Secondary | ICD-10-CM | POA: Diagnosis not present

## 2014-05-04 DIAGNOSIS — I509 Heart failure, unspecified: Secondary | ICD-10-CM | POA: Diagnosis not present

## 2014-05-04 DIAGNOSIS — I10 Essential (primary) hypertension: Secondary | ICD-10-CM | POA: Diagnosis not present

## 2014-05-04 DIAGNOSIS — D649 Anemia, unspecified: Secondary | ICD-10-CM | POA: Diagnosis not present

## 2014-05-04 DIAGNOSIS — I2510 Atherosclerotic heart disease of native coronary artery without angina pectoris: Secondary | ICD-10-CM | POA: Diagnosis not present

## 2014-05-07 DIAGNOSIS — H1851 Endothelial corneal dystrophy: Secondary | ICD-10-CM | POA: Diagnosis not present

## 2014-05-07 DIAGNOSIS — H3531 Nonexudative age-related macular degeneration: Secondary | ICD-10-CM | POA: Diagnosis not present

## 2014-06-26 DIAGNOSIS — I10 Essential (primary) hypertension: Secondary | ICD-10-CM | POA: Diagnosis not present

## 2014-06-26 DIAGNOSIS — I2510 Atherosclerotic heart disease of native coronary artery without angina pectoris: Secondary | ICD-10-CM | POA: Diagnosis not present

## 2014-06-26 DIAGNOSIS — E871 Hypo-osmolality and hyponatremia: Secondary | ICD-10-CM | POA: Diagnosis not present

## 2014-06-26 DIAGNOSIS — J449 Chronic obstructive pulmonary disease, unspecified: Secondary | ICD-10-CM | POA: Diagnosis not present

## 2014-07-30 DIAGNOSIS — I2510 Atherosclerotic heart disease of native coronary artery without angina pectoris: Secondary | ICD-10-CM | POA: Diagnosis not present

## 2014-07-30 DIAGNOSIS — E785 Hyperlipidemia, unspecified: Secondary | ICD-10-CM | POA: Diagnosis not present

## 2014-07-30 DIAGNOSIS — R7309 Other abnormal glucose: Secondary | ICD-10-CM | POA: Diagnosis not present

## 2014-07-30 DIAGNOSIS — I252 Old myocardial infarction: Secondary | ICD-10-CM | POA: Diagnosis not present

## 2014-07-30 DIAGNOSIS — I5020 Unspecified systolic (congestive) heart failure: Secondary | ICD-10-CM | POA: Diagnosis not present

## 2014-07-30 DIAGNOSIS — J449 Chronic obstructive pulmonary disease, unspecified: Secondary | ICD-10-CM | POA: Diagnosis not present

## 2014-07-30 DIAGNOSIS — I10 Essential (primary) hypertension: Secondary | ICD-10-CM | POA: Diagnosis not present

## 2014-07-30 DIAGNOSIS — D649 Anemia, unspecified: Secondary | ICD-10-CM | POA: Diagnosis not present

## 2014-10-29 DIAGNOSIS — F17290 Nicotine dependence, other tobacco product, uncomplicated: Secondary | ICD-10-CM | POA: Diagnosis not present

## 2014-10-29 DIAGNOSIS — I509 Heart failure, unspecified: Secondary | ICD-10-CM | POA: Diagnosis not present

## 2014-10-29 DIAGNOSIS — I10 Essential (primary) hypertension: Secondary | ICD-10-CM | POA: Diagnosis not present

## 2014-10-29 DIAGNOSIS — E785 Hyperlipidemia, unspecified: Secondary | ICD-10-CM | POA: Diagnosis not present

## 2014-10-29 DIAGNOSIS — M810 Age-related osteoporosis without current pathological fracture: Secondary | ICD-10-CM | POA: Diagnosis not present

## 2014-10-29 DIAGNOSIS — M1990 Unspecified osteoarthritis, unspecified site: Secondary | ICD-10-CM | POA: Diagnosis not present

## 2014-10-29 DIAGNOSIS — J449 Chronic obstructive pulmonary disease, unspecified: Secondary | ICD-10-CM | POA: Diagnosis not present

## 2014-10-29 DIAGNOSIS — I2510 Atherosclerotic heart disease of native coronary artery without angina pectoris: Secondary | ICD-10-CM | POA: Diagnosis not present

## 2014-11-05 ENCOUNTER — Emergency Department (HOSPITAL_COMMUNITY): Payer: Medicare Other

## 2014-11-05 ENCOUNTER — Inpatient Hospital Stay (HOSPITAL_COMMUNITY)
Admission: EM | Admit: 2014-11-05 | Discharge: 2014-11-07 | DRG: 287 | Disposition: A | Discharge status: Home / Self Care | Payer: Medicare Other | Attending: Cardiology | Admitting: Cardiology

## 2014-11-05 ENCOUNTER — Encounter (HOSPITAL_COMMUNITY): Payer: Self-pay | Admitting: *Deleted

## 2014-11-05 DIAGNOSIS — R0600 Dyspnea, unspecified: Secondary | ICD-10-CM

## 2014-11-05 DIAGNOSIS — I2510 Atherosclerotic heart disease of native coronary artery without angina pectoris: Secondary | ICD-10-CM | POA: Diagnosis not present

## 2014-11-05 DIAGNOSIS — E780 Pure hypercholesterolemia: Secondary | ICD-10-CM | POA: Diagnosis present

## 2014-11-05 DIAGNOSIS — M8000XA Age-related osteoporosis with current pathological fracture, unspecified site, initial encounter for fracture: Secondary | ICD-10-CM | POA: Diagnosis present

## 2014-11-05 DIAGNOSIS — Z955 Presence of coronary angioplasty implant and graft: Secondary | ICD-10-CM

## 2014-11-05 DIAGNOSIS — I5022 Chronic systolic (congestive) heart failure: Secondary | ICD-10-CM | POA: Diagnosis present

## 2014-11-05 DIAGNOSIS — I509 Heart failure, unspecified: Secondary | ICD-10-CM | POA: Diagnosis not present

## 2014-11-05 DIAGNOSIS — I200 Unstable angina: Secondary | ICD-10-CM | POA: Diagnosis present

## 2014-11-05 DIAGNOSIS — I10 Essential (primary) hypertension: Secondary | ICD-10-CM | POA: Diagnosis present

## 2014-11-05 DIAGNOSIS — R9431 Abnormal electrocardiogram [ECG] [EKG]: Secondary | ICD-10-CM | POA: Diagnosis not present

## 2014-11-05 DIAGNOSIS — J449 Chronic obstructive pulmonary disease, unspecified: Secondary | ICD-10-CM | POA: Diagnosis present

## 2014-11-05 DIAGNOSIS — I252 Old myocardial infarction: Secondary | ICD-10-CM | POA: Diagnosis not present

## 2014-11-05 DIAGNOSIS — M1990 Unspecified osteoarthritis, unspecified site: Secondary | ICD-10-CM | POA: Diagnosis present

## 2014-11-05 DIAGNOSIS — E871 Hypo-osmolality and hyponatremia: Secondary | ICD-10-CM | POA: Diagnosis not present

## 2014-11-05 DIAGNOSIS — I25119 Atherosclerotic heart disease of native coronary artery with unspecified angina pectoris: Secondary | ICD-10-CM | POA: Diagnosis present

## 2014-11-05 DIAGNOSIS — E785 Hyperlipidemia, unspecified: Secondary | ICD-10-CM | POA: Diagnosis not present

## 2014-11-05 DIAGNOSIS — R079 Chest pain, unspecified: Secondary | ICD-10-CM | POA: Diagnosis not present

## 2014-11-05 DIAGNOSIS — R0602 Shortness of breath: Secondary | ICD-10-CM | POA: Diagnosis not present

## 2014-11-05 DIAGNOSIS — Z87891 Personal history of nicotine dependence: Secondary | ICD-10-CM

## 2014-11-05 DIAGNOSIS — D649 Anemia, unspecified: Secondary | ICD-10-CM | POA: Diagnosis not present

## 2014-11-05 LAB — COMPREHENSIVE METABOLIC PANEL
ALBUMIN: 3.8 g/dL (ref 3.5–5.0)
ALK PHOS: 47 U/L (ref 38–126)
ALT: 13 U/L — ABNORMAL LOW (ref 14–54)
ANION GAP: 9 (ref 5–15)
AST: 17 U/L (ref 15–41)
BILIRUBIN TOTAL: 0.5 mg/dL (ref 0.3–1.2)
BUN: 12 mg/dL (ref 6–20)
CALCIUM: 9.2 mg/dL (ref 8.9–10.3)
CO2: 26 mmol/L (ref 22–32)
Chloride: 99 mmol/L — ABNORMAL LOW (ref 101–111)
Creatinine, Ser: 0.89 mg/dL (ref 0.44–1.00)
GFR calc Af Amer: >60 mL/min (ref >60)
GFR calc non Af Amer: 59 mL/min — ABNORMAL LOW (ref >60)
GLUCOSE: 98 mg/dL (ref 65–99)
Potassium: 3.7 mmol/L (ref 3.5–5.1)
Sodium: 134 mmol/L — ABNORMAL LOW (ref 135–145)
TOTAL PROTEIN: 6.3 g/dL — AB (ref 6.5–8.1)

## 2014-11-05 LAB — BASIC METABOLIC PANEL
ANION GAP: 9 (ref 5–15)
BUN: 14 mg/dL (ref 6–20)
CO2: 28 mmol/L (ref 22–32)
Calcium: 9.8 mg/dL (ref 8.9–10.3)
Chloride: 98 mmol/L — ABNORMAL LOW (ref 101–111)
Creatinine, Ser: 0.91 mg/dL (ref 0.44–1.00)
GFR calc Af Amer: >60 mL/min (ref >60)
GFR, EST NON AFRICAN AMERICAN: 58 mL/min — AB (ref >60)
GLUCOSE: 95 mg/dL (ref 65–99)
Potassium: 4.6 mmol/L (ref 3.5–5.1)
Sodium: 135 mmol/L (ref 135–145)

## 2014-11-05 LAB — CBC
HEMATOCRIT: 38 % (ref 36.0–46.0)
HEMOGLOBIN: 12.8 g/dL (ref 12.0–15.0)
MCH: 31.8 pg (ref 26.0–34.0)
MCHC: 33.7 g/dL (ref 30.0–36.0)
MCV: 94.3 fL (ref 78.0–100.0)
Platelets: 221 10*3/uL (ref 150–400)
RBC: 4.03 MIL/uL (ref 3.87–5.11)
RDW: 12.3 % (ref 11.5–15.5)
WBC: 5.9 10*3/uL (ref 4.0–10.5)

## 2014-11-05 LAB — CBC WITH DIFFERENTIAL/PLATELET
BASOS ABS: 0 10*3/uL (ref 0.0–0.1)
Basophils Relative: 1 % (ref 0–1)
EOS PCT: 5 % (ref 0–5)
Eosinophils Absolute: 0.3 10*3/uL (ref 0.0–0.7)
HCT: 35.8 % — ABNORMAL LOW (ref 36.0–46.0)
HEMOGLOBIN: 11.8 g/dL — AB (ref 12.0–15.0)
LYMPHS ABS: 1.8 10*3/uL (ref 0.7–4.0)
LYMPHS PCT: 33 % (ref 12–46)
MCH: 30.8 pg (ref 26.0–34.0)
MCHC: 33 g/dL (ref 30.0–36.0)
MCV: 93.5 fL (ref 78.0–100.0)
Monocytes Absolute: 0.6 10*3/uL (ref 0.1–1.0)
Monocytes Relative: 10 % (ref 3–12)
NEUTROS PCT: 51 % (ref 43–77)
Neutro Abs: 2.9 10*3/uL (ref 1.7–7.7)
PLATELETS: 162 10*3/uL (ref 150–400)
RBC: 3.83 MIL/uL — AB (ref 3.87–5.11)
RDW: 12.4 % (ref 11.5–15.5)
WBC: 5.6 10*3/uL (ref 4.0–10.5)

## 2014-11-05 LAB — I-STAT TROPONIN, ED: Troponin i, poc: 0 ng/mL (ref 0.00–0.08)

## 2014-11-05 LAB — TROPONIN I: Troponin I: <0.03 ng/mL (ref <0.031)

## 2014-11-05 LAB — BRAIN NATRIURETIC PEPTIDE: B Natriuretic Peptide: 130.8 pg/mL — ABNORMAL HIGH (ref 0.0–100.0)

## 2014-11-05 MED ORDER — PANTOPRAZOLE SODIUM 40 MG PO TBEC
40.0000 mg | DELAYED_RELEASE_TABLET | Freq: Every day | ORAL | Status: DC
  Administered 2014-11-07: 40 mg via ORAL
  Filled 2014-11-05: qty 1

## 2014-11-05 MED ORDER — ALBUTEROL SULFATE (2.5 MG/3ML) 0.083% IN NEBU: 3.0000 mL | INHALATION_SOLUTION | Freq: Four times a day (QID) | RESPIRATORY_TRACT | Status: DC | PRN

## 2014-11-05 MED ORDER — NITROGLYCERIN IN D5W 200-5 MCG/ML-% IV SOLN: 10.0000 ug/min | INTRAVENOUS | Status: DC

## 2014-11-05 MED ORDER — NITROGLYCERIN 0.4 MG SL SUBL: 0.4000 mg | SUBLINGUAL_TABLET | SUBLINGUAL | Status: DC | PRN

## 2014-11-05 MED ORDER — SODIUM CHLORIDE 0.9 % IV SOLN: 250.0000 mL | INTRAVENOUS | Status: DC | PRN

## 2014-11-05 MED ORDER — CLOPIDOGREL BISULFATE 75 MG PO TABS
75.0000 mg | ORAL_TABLET | Freq: Every day | ORAL | Status: DC
  Filled 2014-11-05: qty 1

## 2014-11-05 MED ORDER — HEPARIN (PORCINE) IN NACL 100-0.45 UNIT/ML-% IJ SOLN
650.0000 [IU]/h | INTRAMUSCULAR | Status: DC
  Administered 2014-11-05: 650 [IU]/h via INTRAVENOUS
  Filled 2014-11-05 (×2): qty 250

## 2014-11-05 MED ORDER — CITALOPRAM HYDROBROMIDE 40 MG PO TABS
40.0000 mg | ORAL_TABLET | Freq: Every day | ORAL | Status: DC
  Administered 2014-11-07: 40 mg via ORAL
  Filled 2014-11-05 (×2): qty 1
  Filled 2014-11-05: qty 2

## 2014-11-05 MED ORDER — ROPINIROLE HCL 0.25 MG PO TABS
0.2500 mg | ORAL_TABLET | Freq: Every day | ORAL | Status: DC
  Administered 2014-11-05 – 2014-11-06 (×2): 0.25 mg via ORAL
  Filled 2014-11-05 (×4): qty 1

## 2014-11-05 MED ORDER — ASPIRIN EC 81 MG PO TBEC
81.0000 mg | DELAYED_RELEASE_TABLET | Freq: Every day | ORAL | Status: DC
  Filled 2014-11-05: qty 1

## 2014-11-05 MED ORDER — CLOPIDOGREL BISULFATE 75 MG PO TABS
75.0000 mg | ORAL_TABLET | Freq: Every day | ORAL | Status: AC
  Administered 2014-11-06: 75 mg via ORAL
  Filled 2014-11-05: qty 1

## 2014-11-05 MED ORDER — ASPIRIN 300 MG RE SUPP
300.0000 mg | RECTAL | Status: AC
  Filled 2014-11-05: qty 1

## 2014-11-05 MED ORDER — SODIUM CHLORIDE 0.9 % IJ SOLN: 3.0000 mL | INTRAMUSCULAR | Status: DC | PRN

## 2014-11-05 MED ORDER — ASPIRIN 81 MG PO CHEW
81.0000 mg | CHEWABLE_TABLET | ORAL | Status: AC
  Administered 2014-11-06: 81 mg via ORAL
  Filled 2014-11-05: qty 1

## 2014-11-05 MED ORDER — SODIUM CHLORIDE 0.9 % WEIGHT BASED INFUSION
1.0000 mL/kg/h | INTRAVENOUS | Status: DC
  Administered 2014-11-06: 1 mL/kg/h via INTRAVENOUS

## 2014-11-05 MED ORDER — ONDANSETRON HCL 4 MG/2ML IJ SOLN: 4.0000 mg | Freq: Four times a day (QID) | INTRAMUSCULAR | Status: DC | PRN

## 2014-11-05 MED ORDER — SODIUM CHLORIDE 0.9 % IJ SOLN
3.0000 mL | Freq: Two times a day (BID) | INTRAMUSCULAR | Status: DC
  Administered 2014-11-05 – 2014-11-06 (×2): 3 mL via INTRAVENOUS

## 2014-11-05 MED ORDER — CETYLPYRIDINIUM CHLORIDE 0.05 % MT LIQD
7.0000 mL | Freq: Two times a day (BID) | OROMUCOSAL | Status: DC
  Administered 2014-11-05 – 2014-11-07 (×4): 7 mL via OROMUCOSAL

## 2014-11-05 MED ORDER — RAMIPRIL 10 MG PO CAPS
10.0000 mg | ORAL_CAPSULE | Freq: Every day | ORAL | Status: DC
  Administered 2014-11-06 – 2014-11-07 (×2): 10 mg via ORAL
  Filled 2014-11-05 (×3): qty 1

## 2014-11-05 MED ORDER — ASPIRIN 325 MG PO TABS
325.0000 mg | ORAL_TABLET | Freq: Once | ORAL | Status: AC
  Administered 2014-11-05: 325 mg via ORAL
  Filled 2014-11-05: qty 1

## 2014-11-05 MED ORDER — NITROGLYCERIN IN D5W 200-5 MCG/ML-% IV SOLN
10.0000 ug/min | INTRAVENOUS | Status: DC
  Administered 2014-11-05: 10 ug/min via INTRAVENOUS
  Filled 2014-11-05: qty 250

## 2014-11-05 MED ORDER — ASPIRIN 81 MG PO CHEW: 324.0000 mg | CHEWABLE_TABLET | ORAL | Status: AC

## 2014-11-05 MED ORDER — ASPIRIN EC 81 MG PO TBEC: 81.0000 mg | DELAYED_RELEASE_TABLET | Freq: Every day | ORAL | Status: DC

## 2014-11-05 MED ORDER — HEPARIN BOLUS VIA INFUSION
3000.0000 [IU] | Freq: Once | INTRAVENOUS | Status: AC
  Administered 2014-11-05: 3000 [IU] via INTRAVENOUS
  Filled 2014-11-05: qty 3000

## 2014-11-05 MED ORDER — ACETAMINOPHEN 325 MG PO TABS
650.0000 mg | ORAL_TABLET | ORAL | Status: DC | PRN
  Administered 2014-11-06: 650 mg via ORAL
  Filled 2014-11-05: qty 2

## 2014-11-05 MED ORDER — METOPROLOL TARTRATE 25 MG PO TABS
25.0000 mg | ORAL_TABLET | Freq: Two times a day (BID) | ORAL | Status: DC
  Administered 2014-11-05 – 2014-11-07 (×4): 25 mg via ORAL
  Filled 2014-11-05 (×7): qty 1

## 2014-11-05 MED ORDER — SODIUM CHLORIDE 0.9 % IV SOLN
INTRAVENOUS | Status: DC
  Administered 2014-11-07: 10 mL/h via INTRAVENOUS

## 2014-11-05 MED ORDER — ATORVASTATIN CALCIUM 40 MG PO TABS
40.0000 mg | ORAL_TABLET | Freq: Every day | ORAL | Status: DC
  Administered 2014-11-07: 40 mg via ORAL
  Filled 2014-11-05 (×3): qty 1

## 2014-11-05 NOTE — ED Notes (Signed)
Patient with reported sob last night.  She states she had a hard time last night.  She states she had chest pain last night.  Patient has hx of blockage with stent placement.  Patient was seen by Dr Kateri Plummer and sent to ED for further eval due to inverted t waves.  Patient denies any chest pain at this time.  She is alert and oriented.  Patient took her meds prior to coming to ED.  Her cardiologist is Dr Sharyn Lull

## 2014-11-05 NOTE — ED Provider Notes (Signed)
Medical screening examination/treatment/procedure(s) were conducted as a shared visit with non-physician practitioner(s) and myself.  I personally evaluated the patient during the encounter.  81 yo F w/ h/o NSTEMI in march here with left shoulder pain and worsening dyspnea. Afebrile on exam, VS WNL. No wheezing, murmurs, rubs or gallops. ECG with new changes concerning for NSTEMI. First troponin negative, plan to admit to Dr. Sharyn Lull on Heparin.    EKG Interpretation   Date/Time:  Monday November 05 2014 12:33:49 EDT Ventricular Rate:  65 PR Interval:  146 QRS Duration: 86 QT Interval:  406 QTC Calculation: 422 R Axis:   77 Text Interpretation:  Normal sinus rhythm Septal infarct , age  undetermined ST \\T \ T wave abnormality, consider inferolateral ischemia  Abnormal ECG new inferior, lateral changes since 04/21/2014 Confirmed by  [facility] MD, Barbara Cower [phone]) on 11/05/2014 12:48:35 PM        Marily Memos, MD 11/05/14 [id number]

## 2014-11-05 NOTE — ED Notes (Addendum)
Patient ate at this time.

## 2014-11-05 NOTE — Progress Notes (Signed)
ANTICOAGULATION CONSULT NOTE - Initial Consult  Pharmacy Consult for Heparin Indication: chest pain/ACS  No Known Allergies  Patient Measurements: Height: 4' 11.84" (152 cm) Weight: 124 lb (56.246 kg) IBW/kg (Calculated) : 45.14 Heparin Dosing Weight: 56 kg  Vital Signs: Temp: 97.8 F (36.6 C) (08/22 1224) Temp Source: Oral (08/22 1224) BP: 169/76 mmHg (08/22 1515) Pulse Rate: 65 (08/22 1515)  Labs:  Recent Labs  11/05/14 1241  HGB 12.8  HCT 38.0  PLT 221  CREATININE 0.91    CrCl cannot be calculated (Unknown ideal weight.).   Medical History: Past Medical History  Diagnosis Date  . Hypertension   . COPD (chronic obstructive pulmonary disease)   . Blood transfusion without reported diagnosis   . Myocardial infarction   . Coronary artery disease     Medications:   (Not in a hospital admission) Scheduled:  . aspirin  325 mg Oral Once   Infusions:  . nitroGLYCERIN      Assessment: 81yo female with history of HTN, COPD, CAD and previous MI presents with SOB and L shoulder pain. Pharmacy is consulted to dose heparin for ACS/chest pain. CBC is wnl, sCr 0.9, Trop neg x1.  Goal of Therapy:  Heparin level 0.3-0.7 units/ml Monitor platelets by anticoagulation protocol: Yes   Plan:  Give 3000 units bolus x 1 Start heparin infusion at 650 units/hr Check anti-Xa level in 8 hours and daily while on heparin Continue to monitor H&H and platelets  Arlean Hopping. Newman Pies, PharmD Clinical Pharmacist Pager [phone] 11/05/2014,3:32 PM

## 2014-11-05 NOTE — ED Provider Notes (Signed)
CSN: [id number]     Arrival date & time 11/05/14  1218 History   First MD Initiated Contact with Patient 11/05/14 1446     Chief Complaint  Patient presents with  . Shortness of Breath     (Consider location/radiation/quality/duration/timing/severity/associated sxs/prior Treatment) The history is provided by the patient.     Pt with hx CAD s/p anteroseptal MI 04/2014 with LAD stent placement, CHF, COPD, HTN p/w EKG changes, sent in by PCP office.  States yesterday around 3pm she developed SOB and left posterior shoulder/shoulder blade pain that felt like pressure.  She was much more tired than usual and went to bed at 5pm.  States her symptoms were improved when she woke up after a few hours of sleep.  Pt was seen last week by Dr Sharyn Lull and amlodipine was added as patient's hypertension not well controlled.  No other medication changes.  Denies fevers, cough, leg swelling, any chest pain.  Notes that with her last MI she had right shoulder pain radiation into her neck, never had chest pain.   Past Medical History  Diagnosis Date  . Hypertension   . COPD (chronic obstructive pulmonary disease)   . Blood transfusion without reported diagnosis   . Myocardial infarction   . Coronary artery disease    Past Surgical History  Procedure Laterality Date  . Left heart catheterization with coronary angiogram N/A 04/21/2014    Procedure: LEFT HEART CATHETERIZATION WITH CORONARY ANGIOGRAM;  Surgeon: Robynn Pane, MD;  Location: [facility] CATH LAB;  Service: Cardiovascular;  Laterality: N/A;  . Cardiac catheterization    . Coronary angioplasty     No family history on file. Social History  Substance Use Topics  . Smoking status: Former Smoker    Quit date: 03/17/1983  . Smokeless tobacco: None  . Alcohol Use: No   OB History    No data available     Review of Systems  All other systems reviewed and are negative.     Allergies  Review of patient's allergies indicates no known  allergies.  Home Medications   Prior to Admission medications   Medication Sig Start Date End Date Taking? Authorizing Provider  albuterol (PROVENTIL HFA;VENTOLIN HFA) 108 (90 BASE) MCG/ACT inhaler Inhale 1 puff into the lungs every 6 (six) hours as needed for wheezing or shortness of breath.    Historical Provider, MD  aspirin EC 81 MG EC tablet Take 1 tablet (81 mg total) by mouth daily. 04/23/14   Rinaldo Cloud, MD  atorvastatin (LIPITOR) 40 MG tablet Take 1 tablet (40 mg total) by mouth daily at 6 PM. 04/23/14   Rinaldo Cloud, MD  citalopram (CELEXA) 40 MG tablet Take 40 mg by mouth daily.    Historical Provider, MD  cloNIDine (CATAPRES) 0.1 MG tablet Take 1 tablet (0.1 mg total) by mouth 2 (two) times daily. 04/23/14   Rinaldo Cloud, MD  clopidogrel (PLAVIX) 75 MG tablet Take 1 tablet (75 mg total) by mouth daily with breakfast. 04/23/14   Rinaldo Cloud, MD  diphenhydramine-acetaminophen (TYLENOL PM) 25-500 MG TABS Take 1 tablet by mouth at bedtime.    Historical Provider, MD  ferrous sulfate 325 (65 FE) MG tablet Take 1 tablet (325 mg total) by mouth 3 (three) times daily with meals. 04/23/14   Rinaldo Cloud, MD  Menthol, Topical Analgesic, (BIOFREEZE EX) Apply 1 application topically as needed (or pain).    Historical Provider, MD  metoprolol succinate (TOPROL XL) 100 MG 24 hr tablet Take 1 tablet (  100 mg total) by mouth daily. Take with or immediately following a meal. 04/23/14   Rinaldo Cloud, MD  nitroGLYCERIN (NITROSTAT) 0.4 MG SL tablet Place 1 tablet (0.4 mg total) under the tongue every 5 (five) minutes x 3 doses as needed for chest pain. 04/23/14   Rinaldo Cloud, MD  pantoprazole (PROTONIX) 40 MG tablet Take 1 tablet (40 mg total) by mouth daily. 04/23/14   Rinaldo Cloud, MD  ramipril (ALTACE) 10 MG capsule Take 1 capsule (10 mg total) by mouth daily. 04/23/14   Rinaldo Cloud, MD   BP 179/75 mmHg  Pulse 65  Temp(Src) 97.8 F (36.6 C) (Oral)  Wt 124 lb (56.246 kg)  SpO2 96% Physical Exam   Constitutional: She appears well-developed and well-nourished. No distress.  HENT:  Head: Normocephalic and atraumatic.  Neck: Neck supple.  Cardiovascular: Normal rate and regular rhythm.   Pulmonary/Chest: Effort normal and breath sounds normal. No respiratory distress. She has no wheezes. She has no rales.  Abdominal: Soft. She exhibits no distension. There is no tenderness. There is no rebound and no guarding.  Musculoskeletal: She exhibits no edema.  Neurological: She is alert.  Skin: She is not diaphoretic.  Nursing note and vitals reviewed.   ED Course  Procedures (including critical care time) Labs Review Labs Reviewed  BASIC METABOLIC PANEL - Abnormal; Notable for the following:    Chloride 98 (*)    GFR calc non Af Amer 58 (*)    All other components within normal limits  BRAIN NATRIURETIC PEPTIDE - Abnormal; Notable for the following:    B Natriuretic Peptide 130.8 (*)    All other components within normal limits  CBC  HEPARIN LEVEL (UNFRACTIONATED)  CBC  I-STAT TROPOININ, ED    Imaging Review Dg Chest 2 View  11/05/2014   CLINICAL DATA:  Chest pain radiating to back for 1 day.  EXAM: CHEST  2 VIEW  COMPARISON:  04/20/2014 and CT abdomen 05/27/2011  FINDINGS: Lungs are adequately inflated without consolidation or effusion. There is moderate cardiomegaly. There is moderate calcified plaque over the thoracoabdominal aorta. There is a large hiatal/diaphragmatic hernia unchanged. Compression fracture over the upper and lower thoracic spine unchanged.  IMPRESSION: No acute cardiopulmonary disease.  Stable cardiomegaly.  Stable large hiatal/ diaphragmatic hernia.  Stable thoracic spinal compression fractures.   Electronically Signed   By: Elberta Fortis M.D.   On: 11/05/2014 13:30   I have personally reviewed and evaluated these images and lab results as part of my medical decision-making.   EKG Interpretation   Date/Time:  Monday November 05 2014 12:33:49 EDT Ventricular  Rate:  65 PR Interval:  146 QRS Duration: 86 QT Interval:  406 QTC Calculation: 422 R Axis:   77 Text Interpretation:  Normal sinus rhythm Septal infarct , age  undetermined ST \\T \ T wave abnormality, consider inferolateral ischemia  Abnormal ECG new inferior, lateral changes since 04/21/2014 Confirmed by  [facility] MD, Barbara Cower [id number]) on 11/05/2014 12:48:35 PM       Discussed pt with Dr Clayborne Dana.    3:20 PM Discussed pt with Dr Sharyn Lull who has requested starting heparin and nitroglycerin.    MDM   Final diagnoses:  EKG abnormalities  Dyspnea    Pt with hx CAD s/p NSTEMI earlier this year p/w episode of left upper back pain, SOB, fatigue last night with EKG changes found today.  Pt has T wave inversion and some ST depression of inferior and lateral leads.  Discussed with Dr Sharyn Lull, patient's  cardiology, who will admit the patient.      Trixie Dredge, PA-C 11/05/14 1809  Marily Memos, MD 11/05/14 1919

## 2014-11-05 NOTE — H&P (Signed)
April Rice is an 81 y.o. female.   Chief Complaint: Chest pain associated with shortness of breath HPI: Patient is 81 year old female with past medical history significant for coronary artery disease history of anteroseptal wall MI in February 2016 requiring PTCA stenting to mid LAD, hypertension, hypercholesteremia, history of congestive heart failure secondary to depressed LV systolic function, COPD, osteoarthritis, osteoporosis with T12 compression fracture history of tobacco abuse in the past, came to ER from [place] office for further evaluation and treatment. Patient complained of retrosternal chest pain and shortness of breath last night and this morning and left shoulder pain went to PMDs office had EKG done which showed ST-T wave changes in inferolateral leads and was referred to the ED for further evaluation. Patient presently denies any chest pain nausea vomiting diaphoresis denies any shortness of breath. Patient does give history of progressive worsening exertional dyspnea associated with feeling weak and tired for last few weeks while [place] the [place]. Denies palpitation lightheadedness or syncope denies PND orthopnea or leg swelling.  Past Medical History  Diagnosis Date  . Hypertension   . COPD (chronic obstructive pulmonary disease)   . Blood transfusion without reported diagnosis   . Myocardial infarction   . Coronary artery disease     Past Surgical History  Procedure Laterality Date  . Left heart catheterization with coronary angiogram N/A 04/21/2014    Procedure: LEFT HEART CATHETERIZATION WITH CORONARY ANGIOGRAM;  Surgeon: Clent Demark, MD;  Location: [facility] CATH LAB;  Service: Cardiovascular;  Laterality: N/A;  . Cardiac catheterization    . Coronary angioplasty      No family history on file. Social History:  reports that she quit smoking about 31 years ago. She does not have any smokeless tobacco history on file. She reports that she does not drink alcohol or use illicit  drugs.  Allergies: No Known Allergies   (Not in a hospital admission)  Results for orders placed or performed during the hospital encounter of 11/05/14 (from the past 48 hour(s))  Basic metabolic panel     Status: Abnormal   Collection Time: 11/05/14 12:41 PM  Result Value Ref Range   Sodium 135 135 - 145 mmol/L   Potassium 4.6 3.5 - 5.1 mmol/L   Chloride 98 (L) 101 - 111 mmol/L   CO2 28 22 - 32 mmol/L   Glucose, Bld 95 65 - 99 mg/dL   BUN 14 6 - 20 mg/dL   Creatinine, Ser 0.91 0.44 - 1.00 mg/dL   Calcium 9.8 8.9 - 10.3 mg/dL   GFR calc non Af Amer 58 (L) >60 mL/min   GFR calc Af Amer >60 >60 mL/min    Comment: (NOTE) The eGFR has been calculated using the CKD EPI equation. This calculation has not been validated in all clinical situations. eGFR's persistently <60 mL/min signify possible Chronic Kidney Disease.    Anion gap 9 5 - 15  CBC     Status: None   Collection Time: 11/05/14 12:41 PM  Result Value Ref Range   WBC 5.9 4.0 - 10.5 K/uL   RBC 4.03 3.87 - 5.11 MIL/uL   Hemoglobin 12.8 12.0 - 15.0 g/dL   HCT 38.0 36.0 - 46.0 %   MCV 94.3 78.0 - 100.0 fL   MCH 31.8 26.0 - 34.0 pg   MCHC 33.7 30.0 - 36.0 g/dL   RDW 12.3 11.5 - 15.5 %   Platelets 221 150 - 400 K/uL  Brain natriuretic peptide     Status: Abnormal  Collection Time: 11/05/14 12:41 PM  Result Value Ref Range   B Natriuretic Peptide 130.8 (H) 0.0 - 100.0 pg/mL  I-stat troponin, ED     Status: None   Collection Time: 11/05/14 12:56 PM  Result Value Ref Range   Troponin i, poc 0.00 0.00 - 0.08 ng/mL   Comment 3            Comment: Due to the release kinetics of cTnI, a negative result within the first hours of the onset of symptoms does not rule out myocardial infarction with certainty. If myocardial infarction is still suspected, repeat the test at appropriate intervals.    Dg Chest 2 View  11/05/2014   CLINICAL DATA:  Chest pain radiating to back for 1 day.  EXAM: CHEST  2 VIEW  COMPARISON:   04/20/2014 and CT abdomen 05/27/2011  FINDINGS: Lungs are adequately inflated without consolidation or effusion. There is moderate cardiomegaly. There is moderate calcified plaque over the thoracoabdominal aorta. There is a large hiatal/diaphragmatic hernia unchanged. Compression fracture over the upper and lower thoracic spine unchanged.  IMPRESSION: No acute cardiopulmonary disease.  Stable cardiomegaly.  Stable large hiatal/ diaphragmatic hernia.  Stable thoracic spinal compression fractures.   Electronically Signed   By: Marin Olp M.D.   On: 11/05/2014 13:30    Review of Systems  Constitutional: Positive for malaise/fatigue. Negative for fever and chills.  Eyes: Negative for double vision and photophobia.  Respiratory: Positive for shortness of breath. Negative for cough and hemoptysis.   Cardiovascular: Positive for chest pain. Negative for palpitations, orthopnea and claudication.  Gastrointestinal: Positive for nausea. Negative for vomiting and abdominal pain.  Genitourinary: Negative for dysuria.  Neurological: Positive for weakness. Negative for dizziness and headaches.    Blood pressure 164/70, pulse 67, temperature 97.8 F (36.6 C), temperature source Oral, resp. rate 20, height 4' 11.84" (1.52 m), weight 56.246 kg (124 lb), SpO2 99 %. Physical Exam  Constitutional: She is oriented to person, place, and time.  HENT:  Head: Normocephalic and atraumatic.  Eyes: Conjunctivae are normal. Pupils are equal, round, and reactive to light. Left eye exhibits no discharge. No scleral icterus.  Neck: Normal range of motion. Neck supple. No JVD present. No tracheal deviation present. No thyromegaly present.  Cardiovascular: Normal rate and regular rhythm.   Murmur (Soft systolic murmur and S4 gallop noted) heard. Respiratory:  Decreased breath sound at bases  GI: Soft. Bowel sounds are normal. She exhibits no distension. There is no tenderness. There is no rebound.  Musculoskeletal: She  exhibits no edema or tenderness.  Neurological: She is alert and oriented to person, place, and time.     Assessment/Plan Stable angina rule out MI/rule out restenosis Coronary artery disease history of anteroseptal wall MI in the past status post PCI to LAD in February 2016 Hypertension Hypercholesteremia Compensated systolic congestive heart failure COPD Osteoarthritis Osteoporosis with compression fracture in the past History of tobacco abuse Plan As per orders Discussed with patient and family at length various harsh ulcer treatment i.e. noninvasive stress testing versus left cath possible PTCA stenting is risk and benefits i.e. death MI stroke need for emergency CABG local vascular complications etc. and consents for PCI Charolette Forward 11/05/2014, 4:44 PM

## 2014-11-05 NOTE — ED Notes (Signed)
Attempted Report x1.   

## 2014-11-06 ENCOUNTER — Encounter (HOSPITAL_COMMUNITY): Payer: Self-pay | Admitting: Cardiology

## 2014-11-06 ENCOUNTER — Encounter (HOSPITAL_COMMUNITY)
Admission: EM | Disposition: A | Discharge status: Home / Self Care | Payer: Self-pay | Source: Home / Self Care | Attending: Cardiology

## 2014-11-06 HISTORY — PX: CARDIAC CATHETERIZATION: SHX172

## 2014-11-06 LAB — CBC
HEMATOCRIT: 33.6 % — AB (ref 36.0–46.0)
HEMOGLOBIN: 11.2 g/dL — AB (ref 12.0–15.0)
MCH: 31 pg (ref 26.0–34.0)
MCHC: 33.3 g/dL (ref 30.0–36.0)
MCV: 93.1 fL (ref 78.0–100.0)
PLATELETS: 172 10*3/uL (ref 150–400)
RBC: 3.61 MIL/uL — AB (ref 3.87–5.11)
RDW: 12.2 % (ref 11.5–15.5)
WBC: 5.4 10*3/uL (ref 4.0–10.5)

## 2014-11-06 LAB — BASIC METABOLIC PANEL
ANION GAP: 8 (ref 5–15)
BUN: 12 mg/dL (ref 6–20)
CHLORIDE: 99 mmol/L — AB (ref 101–111)
CO2: 28 mmol/L (ref 22–32)
Calcium: 9.3 mg/dL (ref 8.9–10.3)
Creatinine, Ser: 0.94 mg/dL (ref 0.44–1.00)
GFR calc Af Amer: >60 mL/min (ref >60)
GFR, EST NON AFRICAN AMERICAN: 55 mL/min — AB (ref >60)
Glucose, Bld: 91 mg/dL (ref 65–99)
POTASSIUM: 3.5 mmol/L (ref 3.5–5.1)
SODIUM: 135 mmol/L (ref 135–145)

## 2014-11-06 LAB — LIPID PANEL
CHOLESTEROL: 124 mg/dL (ref 0–200)
HDL: 55 mg/dL (ref >40)
LDL Cholesterol: 49 mg/dL (ref 0–99)
TRIGLYCERIDES: 101 mg/dL (ref <150)
Total CHOL/HDL Ratio: 2.3 RATIO
VLDL: 20 mg/dL (ref 0–40)

## 2014-11-06 LAB — POCT ACTIVATED CLOTTING TIME: Activated Clotting Time: 134 seconds

## 2014-11-06 LAB — PROTIME-INR
INR: 1.13 (ref 0.00–1.49)
PROTHROMBIN TIME: 14.7 s (ref 11.6–15.2)

## 2014-11-06 LAB — TROPONIN I: Troponin I: <0.03 ng/mL (ref <0.031)

## 2014-11-06 LAB — HEPARIN LEVEL (UNFRACTIONATED): Heparin Unfractionated: 0.47 IU/mL (ref 0.30–0.70)

## 2014-11-06 SURGERY — LEFT HEART CATH AND CORONARY ANGIOGRAPHY

## 2014-11-06 MED ORDER — CLOPIDOGREL BISULFATE 75 MG PO TABS
75.0000 mg | ORAL_TABLET | Freq: Every day | ORAL | Status: DC
  Administered 2014-11-07: 75 mg via ORAL
  Filled 2014-11-06 (×2): qty 1

## 2014-11-06 MED ORDER — FENTANYL CITRATE (PF) 100 MCG/2ML IJ SOLN
INTRAMUSCULAR | Status: DC | PRN
  Administered 2014-11-06: 25 ug via INTRAVENOUS

## 2014-11-06 MED ORDER — NITROGLYCERIN IN D5W 200-5 MCG/ML-% IV SOLN
INTRAVENOUS | Status: DC | PRN
  Administered 2014-11-06: 10 ug/min via INTRAVENOUS

## 2014-11-06 MED ORDER — SODIUM CHLORIDE 0.9 % IV SOLN: 250.0000 mL | INTRAVENOUS | Status: DC | PRN

## 2014-11-06 MED ORDER — LIDOCAINE HCL (PF) 1 % IJ SOLN
INTRAMUSCULAR | Status: AC
  Filled 2014-11-06: qty 30

## 2014-11-06 MED ORDER — HEPARIN (PORCINE) IN NACL 2-0.9 UNIT/ML-% IJ SOLN
INTRAMUSCULAR | Status: AC
  Filled 2014-11-06: qty 1500

## 2014-11-06 MED ORDER — HYDRALAZINE HCL 20 MG/ML IJ SOLN
INTRAMUSCULAR | Status: AC
  Filled 2014-11-06: qty 1

## 2014-11-06 MED ORDER — ONDANSETRON HCL 4 MG/2ML IJ SOLN: 4.0000 mg | Freq: Four times a day (QID) | INTRAMUSCULAR | Status: DC | PRN

## 2014-11-06 MED ORDER — SODIUM CHLORIDE 0.9 % IJ SOLN: 3.0000 mL | INTRAMUSCULAR | Status: DC | PRN

## 2014-11-06 MED ORDER — OXYCODONE-ACETAMINOPHEN 5-325 MG PO TABS
1.0000 | ORAL_TABLET | ORAL | Status: DC | PRN
  Administered 2014-11-06 (×2): 1 via ORAL
  Filled 2014-11-06: qty 1

## 2014-11-06 MED ORDER — NITROGLYCERIN 1 MG/10 ML FOR IR/CATH LAB
INTRA_ARTERIAL | Status: AC
  Filled 2014-11-06: qty 10

## 2014-11-06 MED ORDER — HYDRALAZINE HCL 20 MG/ML IJ SOLN
INTRAMUSCULAR | Status: DC | PRN
  Administered 2014-11-06: 10 mg via INTRAVENOUS

## 2014-11-06 MED ORDER — LIDOCAINE HCL (PF) 1 % IJ SOLN
INTRAMUSCULAR | Status: DC | PRN
  Administered 2014-11-06: 14:00:00

## 2014-11-06 MED ORDER — HYDRALAZINE HCL 20 MG/ML IJ SOLN: 10.0000 mg | Freq: Four times a day (QID) | INTRAMUSCULAR | Status: DC | PRN

## 2014-11-06 MED ORDER — SODIUM CHLORIDE 0.9 % IJ SOLN: 3.0000 mL | Freq: Two times a day (BID) | INTRAMUSCULAR | Status: DC

## 2014-11-06 MED ORDER — FENTANYL CITRATE (PF) 100 MCG/2ML IJ SOLN
INTRAMUSCULAR | Status: AC
  Filled 2014-11-06: qty 4

## 2014-11-06 MED ORDER — MIDAZOLAM HCL 2 MG/2ML IJ SOLN
INTRAMUSCULAR | Status: AC
  Filled 2014-11-06: qty 4

## 2014-11-06 MED ORDER — MIDAZOLAM HCL 2 MG/2ML IJ SOLN
INTRAMUSCULAR | Status: DC | PRN
  Administered 2014-11-06: 1 mg via INTRAVENOUS

## 2014-11-06 MED ORDER — SODIUM CHLORIDE 0.9 % WEIGHT BASED INFUSION
3.0000 mL/kg/h | INTRAVENOUS | Status: AC
  Administered 2014-11-06: 3 mL/kg/h via INTRAVENOUS

## 2014-11-06 MED ORDER — SODIUM CHLORIDE 0.9 % IV SOLN
INTRAVENOUS | Status: DC | PRN
  Administered 2014-11-06: 56 mL/h via INTRAVENOUS

## 2014-11-06 MED ORDER — NITROGLYCERIN 1 MG/10 ML FOR IR/CATH LAB
INTRA_ARTERIAL | Status: DC | PRN
  Administered 2014-11-06: 150 ug via INTRA_ARTERIAL

## 2014-11-06 MED ORDER — ASPIRIN EC 325 MG PO TBEC
325.0000 mg | DELAYED_RELEASE_TABLET | Freq: Every day | ORAL | Status: DC
  Administered 2014-11-07: 325 mg via ORAL
  Filled 2014-11-06 (×2): qty 1

## 2014-11-06 MED ORDER — ACETAMINOPHEN 325 MG PO TABS: 650.0000 mg | ORAL_TABLET | ORAL | Status: DC | PRN

## 2014-11-06 SURGICAL SUPPLY — 8 items
CATH INFINITI 5FR MULTPACK ANG (CATHETERS) ×3 IMPLANT
KIT HEART LEFT (KITS) ×3 IMPLANT
PACK CARDIAC CATHETERIZATION (CUSTOM PROCEDURE TRAY) ×3 IMPLANT
SHEATH PINNACLE 5F 10CM (SHEATH) ×3 IMPLANT
SYR MEDRAD MARK V 150ML (SYRINGE) ×3 IMPLANT
TRANSDUCER W/STOPCOCK (MISCELLANEOUS) ×3 IMPLANT
WIRE EMERALD 3MM-J .035X150CM (WIRE) ×3 IMPLANT
WIRE HI TORQ VERSACORE-J 145CM (WIRE) ×3 IMPLANT

## 2014-11-06 NOTE — Progress Notes (Signed)
Patient declines to see cardiac cath video.Patient stated," I have had one before .I don't need to see it."Patient daughter at bedside .Informed patient and daughter if she changes her mind to please let me know.

## 2014-11-06 NOTE — Progress Notes (Signed)
Patient currently is now beginning to rest. Bed rest is expired and patient has been to the bedside commode x1 with assist. Patient has no complaints of pain at this time. Will continue to monitor patient to end of shift.

## 2014-11-06 NOTE — Progress Notes (Signed)
Hematoma forming at rt groin site. BRobinson taking over manual hold

## 2014-11-06 NOTE — Progress Notes (Addendum)
Site area: rt groin fa sheath Site Prior to Removal:  Level 0 Pressure Applied For:   45  minutes Manual:   yes Patient Status During Pull: stable  Post Pull Site:  Level 1   Post Pull Instructions Given:  yes Post Pull Pulses Present: yes Dressing Applied:  tegaderm Bedrest begins @ 1500 Comments:  100cc 0.9NS IV fluid bolus given for low BP; NTG drip off. pt c/o feeling sweaty. Cold cloth to forehead. Dr. Sharyn Lull paged and notified of hematoma. Mostly resolved, puffy area below stick site soft to touch, lightly bruised, approx 4 inches wide. Denies pain at rt groin. C/O HA 6/10-medicated. BP back up. NTG drip restarted.

## 2014-11-06 NOTE — Interval H&P Note (Signed)
Cath Lab Visit (complete for each Cath Lab visit)  Clinical Evaluation Leading to the Procedure:   ACS: Yes.    Non-ACS:    Anginal Classification: CCS IV  Anti-ischemic medical therapy: Maximal Therapy (2 or more classes of medications)  Non-Invasive Test Results: No non-invasive testing performed  Prior CABG: No previous CABG      History and Physical Interval Note:  11/06/2014 12:38 PM  Terie Rabelo  has presented today for surgery, with the diagnosis of unstable angina  The various methods of treatment have been discussed with the patient and family. After consideration of risks, benefits and other options for treatment, the patient has consented to  Procedure(s): Left Heart Cath and Coronary Angiography (N/A) as a surgical intervention .  The patient's history has been reviewed, patient examined, no change in status, stable for surgery.  I have reviewed the patient's chart and labs.  Questions were answered to the patient's satisfaction.     Rinaldo Cloud

## 2014-11-06 NOTE — Progress Notes (Signed)
ANTICOAGULATION CONSULT NOTE  Pharmacy Consult for Heparin Indication: chest pain/ACS  No Known Allergies  Patient Measurements: Height: 5' (152.4 cm) Weight: 122 lb 11.2 oz (55.656 kg) IBW/kg (Calculated) : 45.5 Heparin Dosing Weight: 56 kg  Vital Signs: Temp: 97.8 F (36.6 C) (08/22 1941) Temp Source: Oral (08/22 1941) BP: 161/75 mmHg (08/22 2219) Pulse Rate: 64 (08/22 1941)  Labs:  Recent Labs  11/05/14 1241 11/05/14 2023 11/06/14 0041  HGB 12.8 11.8* 11.2*  HCT 38.0 35.8* 33.6*  PLT 221 162 172  LABPROT  --   --  14.7  INR  --   --  1.13  HEPARINUNFRC  --   --  0.47  CREATININE 0.91 0.89  --   TROPONINI  --  <0.03  --     Estimated Creatinine Clearance: 38.8 mL/min (by C-G formula based on Cr of 0.89).  Assessment: 81 yo female with chest pain/SOB for heparin   Goal of Therapy:  Heparin level 0.3-0.7 units/ml Monitor platelets by anticoagulation protocol: Yes   Plan:  Continue Heparin at current rate  Geannie Risen, PharmD, BCPS  11/06/2014,1:33 AM

## 2014-11-06 NOTE — Progress Notes (Signed)
Utilization review completed. Preslynn Bier, RN, BSN. 

## 2014-11-07 LAB — CBC
HEMATOCRIT: 32.5 % — AB (ref 36.0–46.0)
HEMOGLOBIN: 10.8 g/dL — AB (ref 12.0–15.0)
MCH: 30.9 pg (ref 26.0–34.0)
MCHC: 33.2 g/dL (ref 30.0–36.0)
MCV: 92.9 fL (ref 78.0–100.0)
Platelets: 203 10*3/uL (ref 150–400)
RBC: 3.5 MIL/uL — AB (ref 3.87–5.11)
RDW: 12.5 % (ref 11.5–15.5)
WBC: 6.1 10*3/uL (ref 4.0–10.5)

## 2014-11-07 MED ORDER — ISOSORBIDE MONONITRATE ER 60 MG PO TB24: 60.0000 mg | ORAL_TABLET | Freq: Every day | ORAL | Status: AC

## 2014-11-07 NOTE — Discharge Summary (Signed)
April Rice, April Rice                 ACCOUNT NO.:  [id number]  MEDICAL RECORD NO.:  [id number]  LOCATION:  3E07C                        FACILITY:  [facility]  PHYSICIAN:  Korra Christine N. Sharyn Rice, M.D. DATE OF BIRTH:  [birth year]  DATE OF ADMISSION:  11/05/2014 DATE OF DISCHARGE:  11/07/2014                              DISCHARGE SUMMARY   ADMITTING DIAGNOSES: 1. Unstable angina, rule out myocardial infarction, rule out     restenosis. 2. Coronary artery disease, history of anteroseptal wall myocardial     infarction in the past, status post PCI to LAD in February of 2016. 3. Hypertension. 4. Hypercholesteremia. 5. Compensated systolic congestive heart failure. 6. Chronic obstructive pulmonary disease. 7. Osteoarthritis. 8. Osteoporosis with compression fracture in the past. 9. History of tobacco abuse in the past.  FINAL DIAGNOSES: 1. Stable angina, myocardial infarction ruled out status post cardiac     catheterization, noted to have patent LAD stent, but had     bifurcation, mid to moderate stenosis in LAD and diagonal 2,     stable. 2. Coronary artery disease, history of anteroseptal wall myocardial     infarction in the past status post PCI to LAD in February of 2016     with patent stent. 3. Hypertension. 4. Hypercholesteremia. 5. Compensated congestive heart failure. 6. Chronic obstructive pulmonary disease. 7. Osteoarthritis. 8. Osteoporosis with compression fracture in the past. 9. History of tobacco abuse.  DISCHARGE HOME MEDICATIONS: 1. Isosorbide mononitrate 60 mg daily. 2. Albuterol inhaler 2 puffs every 6 hours as needed as before. 3. Amlodipine 5 mg daily. 4. Aspirin 81 mg daily. 5. Citalopram 40 mg daily. 6. Clopidogrel 75 mg daily. 7. Tylenol PM as needed as before. 8. Nexium 40 mg daily. 9. Boniva 150 mg every 30 days as before. 10.Metoprolol succinate 100 mg daily. 11.Nitrostat sublingual p.r.n. 12.ReQuip 0.25 mg at bedtime. 13.Systane Ultra as  before. 14.Atorvastatin 40 mg daily. 15.Ramipril 10 mg daily.  The patient has been advised to stop clonidine for now.  DIET:  Low salt, low cholesterol.  The patient has been advised to monitor blood pressure daily and chart.  Post-cardiac cath instructions have been given.  FOLLOWUP:  Follow up with me in 1 week.  CONDITION AT DISCHARGE:  Stable.  BRIEF HISTORY AND HOSPITAL COURSE:  April Rice is an 81 year old female with past medical history significant for coronary artery disease, history of anteroseptal wall MI in February of 2016 requiring PTCA stenting to mid LAD, hypertension, hypercholesteremia, history of congestive heart failure secondary to depressed LV systolic function in the past, COPD, osteoarthritis, osteoporosis with T12 compression fracture, history of tobacco abuse in the past.  She came to the ER from PMD's office for further evaluation and treatment.  The patient complained of retrosternal chest pain and shortness of breath last night and this morning, left shoulder pain, went to PMD's office, had EKG done, which showed ST-T wave changes in inferolateral leads and was referred to ED for further evaluation.  The patient presently denies any chest pain, nausea, vomiting, diaphoresis.  Denies any shortness of breath.  The patient does give history of progressive worsening exertional dyspnea associated with feeling weak, tired for last  few weeks while [place] the mall.  Denies any palpitation, lightheadedness, or syncope.  Denies PND, orthopnea, or leg swelling.  PHYSICAL EXAMINATION:  GENERAL:  She was alert, awake, oriented x3. VITAL SIGNS:  Blood pressure was 154/70, pulse 57, she was afebrile. HEENT:  Conjunctivae pink. NECK:  Supple.  No JVD.  No bruit. LUNGS:  Decreased breath sounds at bases. CARDIOVASCULAR:  S1, S2 was normal.  There was soft systolic murmur and S4 gallop. ABDOMEN:  Soft.  Bowel sounds present.  Nontender. EXTREMITIES:  There was  no clubbing, cyanosis, or edema.  LABS:  Four sets of troponin-I were negative.  Sodium was 135, potassium 4.6, BUN 14, creatinine 0.91.  Glucose was 95.  Her cholesterol was 124, triglycerides 101, HDL 55, LDL 49.  Hemoglobin was 12.8, hematocrit 38.0, white count of 5.9.  BRIEF HOSPITAL COURSE:  The patient was admitted to telemetry unit.  MI was ruled out by serial enzymes and EKG.  Discussed with the patient and family at length various options of treatment, i.e., noninvasive stress testing versus left cardiac cath, possible PCI.  The patient subsequently underwent cardiac catheterization yesterday, tolerated the procedure well.  The patient was noted to have normalization of her LV systolic function.  LAD stent was widely patent.  Beyond the stent, the patient had 2 moderately severe bifurcation LAD with diagonal dual stenosis which appears to be more prominent in cranial view as compared to RAO cranial view.  The patient did not have any further episodes of chest pain during the hospital stay.  Her groin is stable with mild ecchymosis.  Plan is to maximize anti-anginal medications.  If she continues to have recurrent chest pain, may consider bifurcation stenting to LAD and diagonal 2 in the future.     April Rice, M.D.     MNH/MEDQ  D:  11/07/2014  T:  11/07/2014  Job:  [id number]

## 2014-11-07 NOTE — Discharge Summary (Signed)
Discharge summary dictated on 11/07/2014, dictation number is [phone]

## 2014-11-07 NOTE — Progress Notes (Signed)
Patient alert and oriented, voiding adequately, mae's well, with no c/o pain or distress noted. Patient discharged home per order. Discharge instructions given to patient and family. Family and patient stated understanding of instructions given. Patient is aware of appointment made for August 31 at 9am.

## 2014-11-07 NOTE — Discharge Instructions (Signed)
Coronary Angiogram °A coronary angiogram, also called coronary angiography, is an X-ray procedure used to look at the arteries in the heart. In this procedure, a dye (contrast dye) is injected through a long, hollow tube (catheter). The catheter is about the size of a piece of cooked spaghetti and is inserted through your groin, wrist, or arm. The dye is injected into each artery, and X-rays are then taken to show if there is a blockage in the arteries of your heart. °LET YOUR HEALTH CARE PROVIDER KNOW ABOUT: °· Any allergies you have, including allergies to shellfish or contrast dye.   °· All medicines you are taking, including vitamins, herbs, eye drops, creams, and over-the-counter medicines.   °· Previous problems you or members of your family have had with the use of anesthetics.   °· Any blood disorders you have.   °· Previous surgeries you have had. °· History of kidney problems or failure.   °· Other medical conditions you have. °RISKS AND COMPLICATIONS  °Generally, a coronary angiogram is a safe procedure. However, problems can occur and include: °· Allergic reaction to the dye. °· Bleeding from the access site or other locations. °· Kidney injury, especially in people with impaired kidney function.  °· Stroke (rare). °· Heart attack (rare). °BEFORE THE PROCEDURE  °· Do not eat or drink anything after midnight the night before the procedure or as directed by your health care provider.   °· Ask your health care provider about changing or stopping your regular medicines. This is especially important if you are taking diabetes medicines or blood thinners. °PROCEDURE °· You may be given a medicine to help you relax (sedative) before the procedure. This medicine is given through an intravenous (IV) access tube that is inserted into one of your veins.   °· The area where the catheter will be inserted will be washed and shaved. This is usually done in the groin but may be done in the fold of your arm (near your  elbow) or in the wrist.    °· A medicine will be given to numb the area where the catheter will be inserted (local anesthetic).   °· The health care provider will insert the catheter into an artery. The catheter will be guided by using a special type of X-ray (fluoroscopy) of the blood vessel being examined.   °· A special dye will then be injected into the catheter, and X-rays will be taken. The dye will help to show where any narrowing or blockages are located in the heart arteries.   °AFTER THE PROCEDURE  °· If the procedure is done through the leg, you will be kept in bed lying flat for several hours. You will be instructed to not bend or cross your legs. °· The insertion site will be checked frequently.   °· The pulse in your feet or wrist will be checked frequently.   °· Additional blood tests, X-rays, and an electrocardiogram may be done.   °Document Released: 09/06/2002 Document Revised: 07/17/2013 Document Reviewed: 07/25/2012 °[employer]® Patient Information ©2015 [employer]. This information is not intended to replace advice given to you by your health care provider. Make sure you discuss any questions you have with your health care provider. °Angina Pectoris °Angina pectoris, often just called angina, is extreme discomfort in your chest, neck, or arm caused by a lack of blood in the middle and thickest layer of your heart wall (myocardium). It may feel like tightness or heavy pressure. It may feel like a crushing or squeezing pain. Some people say it feels like gas or indigestion. It   may go down your shoulders, back, and arms. Some people may have symptoms other than pain. These symptoms include fatigue, shortness of breath, cold sweats, or nausea. There are four different types of angina: °· Stable angina--Stable angina usually occurs in episodes of predictable frequency and duration. It usually is brought on by physical activity, emotional stress, or excitement. These are all times when the myocardium  needs more oxygen. Stable angina usually lasts a few minutes and often is relieved by taking a medicine that can be taken under your tongue (sublingually). The medicine is called nitroglycerin. Stable angina is caused by a buildup of plaque inside the arteries, which restricts blood flow to the heart muscle (atherosclerosis). °· Unstable angina--Unstable angina can occur even when your body experiences little or no physical exertion. It can occur during sleep. It can also occur at rest. It can suddenly increase in severity or frequency. It might not be relieved by sublingual nitroglycerin. It can last up to 30 minutes. The most common cause of unstable angina is a blood clot that has developed on the top of plaque buildup inside a coronary artery. It can lead to a heart attack if the blood clot completely blocks the artery. °· Microvascular angina--This type of angina is caused by a disorder of tiny blood vessels called arterioles. Microvascular angina is more common in women. The pain may be more severe and last longer than other types of angina pectoris. °· Prinzmetal or variant angina--This type of angina pectoris usually occurs when your body experiences little or no physical exertion. It especially occurs in the early morning hours. It is caused by a spasm of your coronary artery. °HOME CARE INSTRUCTIONS  °· Only take over-the-counter and prescription medicines as directed by your health care provider. °· Stay active or increase your exercise as directed by your health care provider. °· Limit strenuous activity as directed by your health care provider. °· Limit heavy lifting as directed by your health care provider. °· Maintain a healthy weight. °· Learn about and eat heart-healthy foods. °· Do not use any tobacco products including cigarettes, chewing tobacco or electronic cigarettes. °SEEK IMMEDIATE MEDICAL CARE IF:  °You experience the following symptoms: °· Chest, neck, deep shoulder, or arm pain or  discomfort that lasts more than a few minutes. °· Chest, neck, deep shoulder, or arm pain or discomfort that goes away and comes back, repeatedly. °· Heavy sweating with discomfort, without a noticeable cause. °· Shortness of breath or difficulty breathing. °· Angina that does not get better after a few minutes of rest or after taking sublingual nitroglycerin. °These can all be symptoms of a heart attack, which is a medical emergency! Get medical help at once. Call your local emergency service (911 in [place] immediately. Do not  drive yourself to the hospital and do not  wait to for your symptoms to go away. °MAKE SURE YOU: °· Understand these instructions. °· Will watch your condition. °· Will get help right away if you are not doing well or get worse. °Document Released: 03/02/2005 Document Revised: 03/07/2013 Document Reviewed: 07/04/2013 °[employer]® Patient Information ©2015 [employer]. This information is not intended to replace advice given to you by your health care provider. Make sure you discuss any questions you have with your health care provider. ° °

## 2014-11-14 DIAGNOSIS — E785 Hyperlipidemia, unspecified: Secondary | ICD-10-CM | POA: Diagnosis not present

## 2014-11-14 DIAGNOSIS — I2510 Atherosclerotic heart disease of native coronary artery without angina pectoris: Secondary | ICD-10-CM | POA: Diagnosis not present

## 2014-11-14 DIAGNOSIS — J449 Chronic obstructive pulmonary disease, unspecified: Secondary | ICD-10-CM | POA: Diagnosis not present

## 2014-11-14 DIAGNOSIS — D649 Anemia, unspecified: Secondary | ICD-10-CM | POA: Diagnosis not present

## 2014-11-14 DIAGNOSIS — I252 Old myocardial infarction: Secondary | ICD-10-CM | POA: Diagnosis not present

## 2014-11-14 DIAGNOSIS — I509 Heart failure, unspecified: Secondary | ICD-10-CM | POA: Diagnosis not present

## 2014-11-14 DIAGNOSIS — I209 Angina pectoris, unspecified: Secondary | ICD-10-CM | POA: Diagnosis not present

## 2014-11-14 DIAGNOSIS — I10 Essential (primary) hypertension: Secondary | ICD-10-CM | POA: Diagnosis not present

## 2014-11-21 DIAGNOSIS — E785 Hyperlipidemia, unspecified: Secondary | ICD-10-CM | POA: Diagnosis not present

## 2014-11-21 DIAGNOSIS — I252 Old myocardial infarction: Secondary | ICD-10-CM | POA: Diagnosis not present

## 2014-11-21 DIAGNOSIS — D649 Anemia, unspecified: Secondary | ICD-10-CM | POA: Diagnosis not present

## 2014-11-21 DIAGNOSIS — I10 Essential (primary) hypertension: Secondary | ICD-10-CM | POA: Diagnosis not present

## 2014-11-21 DIAGNOSIS — I509 Heart failure, unspecified: Secondary | ICD-10-CM | POA: Diagnosis not present

## 2014-11-21 DIAGNOSIS — I209 Angina pectoris, unspecified: Secondary | ICD-10-CM | POA: Diagnosis not present

## 2014-11-21 DIAGNOSIS — I2510 Atherosclerotic heart disease of native coronary artery without angina pectoris: Secondary | ICD-10-CM | POA: Diagnosis not present

## 2014-11-21 DIAGNOSIS — J449 Chronic obstructive pulmonary disease, unspecified: Secondary | ICD-10-CM | POA: Diagnosis not present

## 2015-02-25 DIAGNOSIS — I119 Hypertensive heart disease without heart failure: Secondary | ICD-10-CM | POA: Diagnosis not present

## 2015-02-25 DIAGNOSIS — I208 Other forms of angina pectoris: Secondary | ICD-10-CM | POA: Diagnosis not present

## 2015-02-25 DIAGNOSIS — M810 Age-related osteoporosis without current pathological fracture: Secondary | ICD-10-CM | POA: Diagnosis not present

## 2015-02-25 DIAGNOSIS — I2510 Atherosclerotic heart disease of native coronary artery without angina pectoris: Secondary | ICD-10-CM | POA: Diagnosis not present

## 2015-02-25 DIAGNOSIS — J449 Chronic obstructive pulmonary disease, unspecified: Secondary | ICD-10-CM | POA: Diagnosis not present

## 2015-02-25 DIAGNOSIS — E785 Hyperlipidemia, unspecified: Secondary | ICD-10-CM | POA: Diagnosis not present

## 2015-02-25 DIAGNOSIS — M1990 Unspecified osteoarthritis, unspecified site: Secondary | ICD-10-CM | POA: Diagnosis not present

## 2015-02-25 DIAGNOSIS — D649 Anemia, unspecified: Secondary | ICD-10-CM | POA: Diagnosis not present

## 2015-03-20 DIAGNOSIS — J449 Chronic obstructive pulmonary disease, unspecified: Secondary | ICD-10-CM | POA: Diagnosis not present

## 2015-03-20 DIAGNOSIS — E785 Hyperlipidemia, unspecified: Secondary | ICD-10-CM | POA: Diagnosis not present

## 2015-03-20 DIAGNOSIS — Z23 Encounter for immunization: Secondary | ICD-10-CM | POA: Diagnosis not present

## 2015-03-20 DIAGNOSIS — J209 Acute bronchitis, unspecified: Secondary | ICD-10-CM | POA: Diagnosis not present

## 2015-03-20 DIAGNOSIS — I10 Essential (primary) hypertension: Secondary | ICD-10-CM | POA: Diagnosis not present

## 2015-03-20 DIAGNOSIS — I2510 Atherosclerotic heart disease of native coronary artery without angina pectoris: Secondary | ICD-10-CM | POA: Diagnosis not present

## 2015-03-20 DIAGNOSIS — E871 Hypo-osmolality and hyponatremia: Secondary | ICD-10-CM | POA: Diagnosis not present

## 2015-05-27 DIAGNOSIS — I2510 Atherosclerotic heart disease of native coronary artery without angina pectoris: Secondary | ICD-10-CM | POA: Diagnosis not present

## 2015-05-27 DIAGNOSIS — I255 Ischemic cardiomyopathy: Secondary | ICD-10-CM | POA: Diagnosis not present

## 2015-05-27 DIAGNOSIS — E785 Hyperlipidemia, unspecified: Secondary | ICD-10-CM | POA: Diagnosis not present

## 2015-05-27 DIAGNOSIS — M1990 Unspecified osteoarthritis, unspecified site: Secondary | ICD-10-CM | POA: Diagnosis not present

## 2015-05-27 DIAGNOSIS — R0609 Other forms of dyspnea: Secondary | ICD-10-CM | POA: Diagnosis not present

## 2015-05-27 DIAGNOSIS — I119 Hypertensive heart disease without heart failure: Secondary | ICD-10-CM | POA: Diagnosis not present

## 2015-05-27 DIAGNOSIS — D649 Anemia, unspecified: Secondary | ICD-10-CM | POA: Diagnosis not present

## 2015-05-27 DIAGNOSIS — I252 Old myocardial infarction: Secondary | ICD-10-CM | POA: Diagnosis not present

## 2015-05-28 DIAGNOSIS — H524 Presbyopia: Secondary | ICD-10-CM | POA: Diagnosis not present

## 2015-05-28 DIAGNOSIS — H35033 Hypertensive retinopathy, bilateral: Secondary | ICD-10-CM | POA: Diagnosis not present

## 2015-05-28 DIAGNOSIS — H26492 Other secondary cataract, left eye: Secondary | ICD-10-CM | POA: Diagnosis not present

## 2015-06-13 DIAGNOSIS — F17290 Nicotine dependence, other tobacco product, uncomplicated: Secondary | ICD-10-CM | POA: Diagnosis not present

## 2015-06-13 DIAGNOSIS — I255 Ischemic cardiomyopathy: Secondary | ICD-10-CM | POA: Diagnosis not present

## 2015-06-13 DIAGNOSIS — D649 Anemia, unspecified: Secondary | ICD-10-CM | POA: Diagnosis not present

## 2015-06-13 DIAGNOSIS — I2510 Atherosclerotic heart disease of native coronary artery without angina pectoris: Secondary | ICD-10-CM | POA: Diagnosis not present

## 2015-06-13 DIAGNOSIS — J449 Chronic obstructive pulmonary disease, unspecified: Secondary | ICD-10-CM | POA: Diagnosis not present

## 2015-06-13 DIAGNOSIS — R0609 Other forms of dyspnea: Secondary | ICD-10-CM | POA: Diagnosis not present

## 2015-06-13 DIAGNOSIS — I252 Old myocardial infarction: Secondary | ICD-10-CM | POA: Diagnosis not present

## 2015-06-13 DIAGNOSIS — I119 Hypertensive heart disease without heart failure: Secondary | ICD-10-CM | POA: Diagnosis not present

## 2015-07-22 DIAGNOSIS — H26491 Other secondary cataract, right eye: Secondary | ICD-10-CM | POA: Diagnosis not present

## 2015-07-22 DIAGNOSIS — H47013 Ischemic optic neuropathy, bilateral: Secondary | ICD-10-CM | POA: Diagnosis not present

## 2015-07-22 DIAGNOSIS — H1851 Endothelial corneal dystrophy: Secondary | ICD-10-CM | POA: Diagnosis not present

## 2015-08-06 DIAGNOSIS — H26491 Other secondary cataract, right eye: Secondary | ICD-10-CM | POA: Diagnosis not present

## 2015-11-18 IMAGING — DX DG CHEST 2V
2 series · 2 of 2 positions shown · non-contrast
Comparison: 04/20/2014 and CT abdomen 05/27/2011

CLINICAL DATA: Chest pain radiating to back for 1 day.

EXAM:
CHEST  2 VIEW

[chest pa]
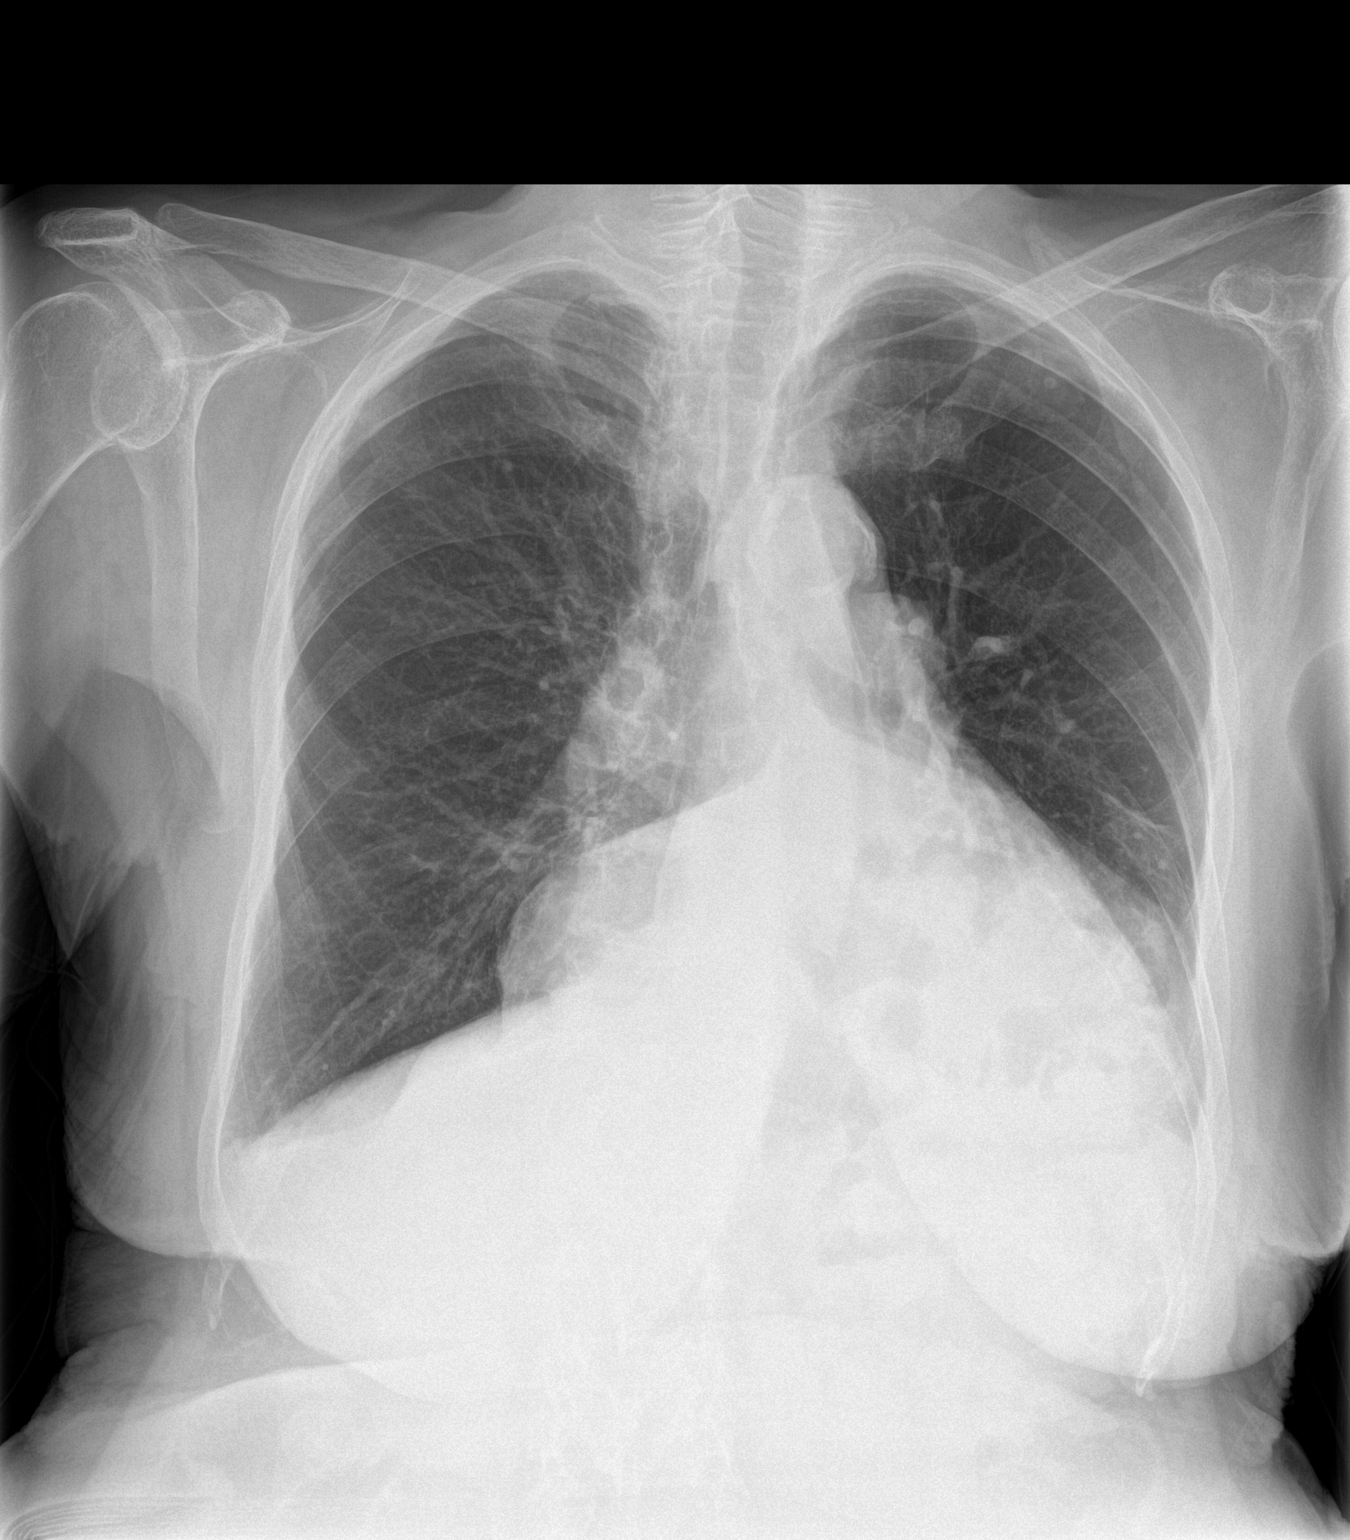

[chest lat]
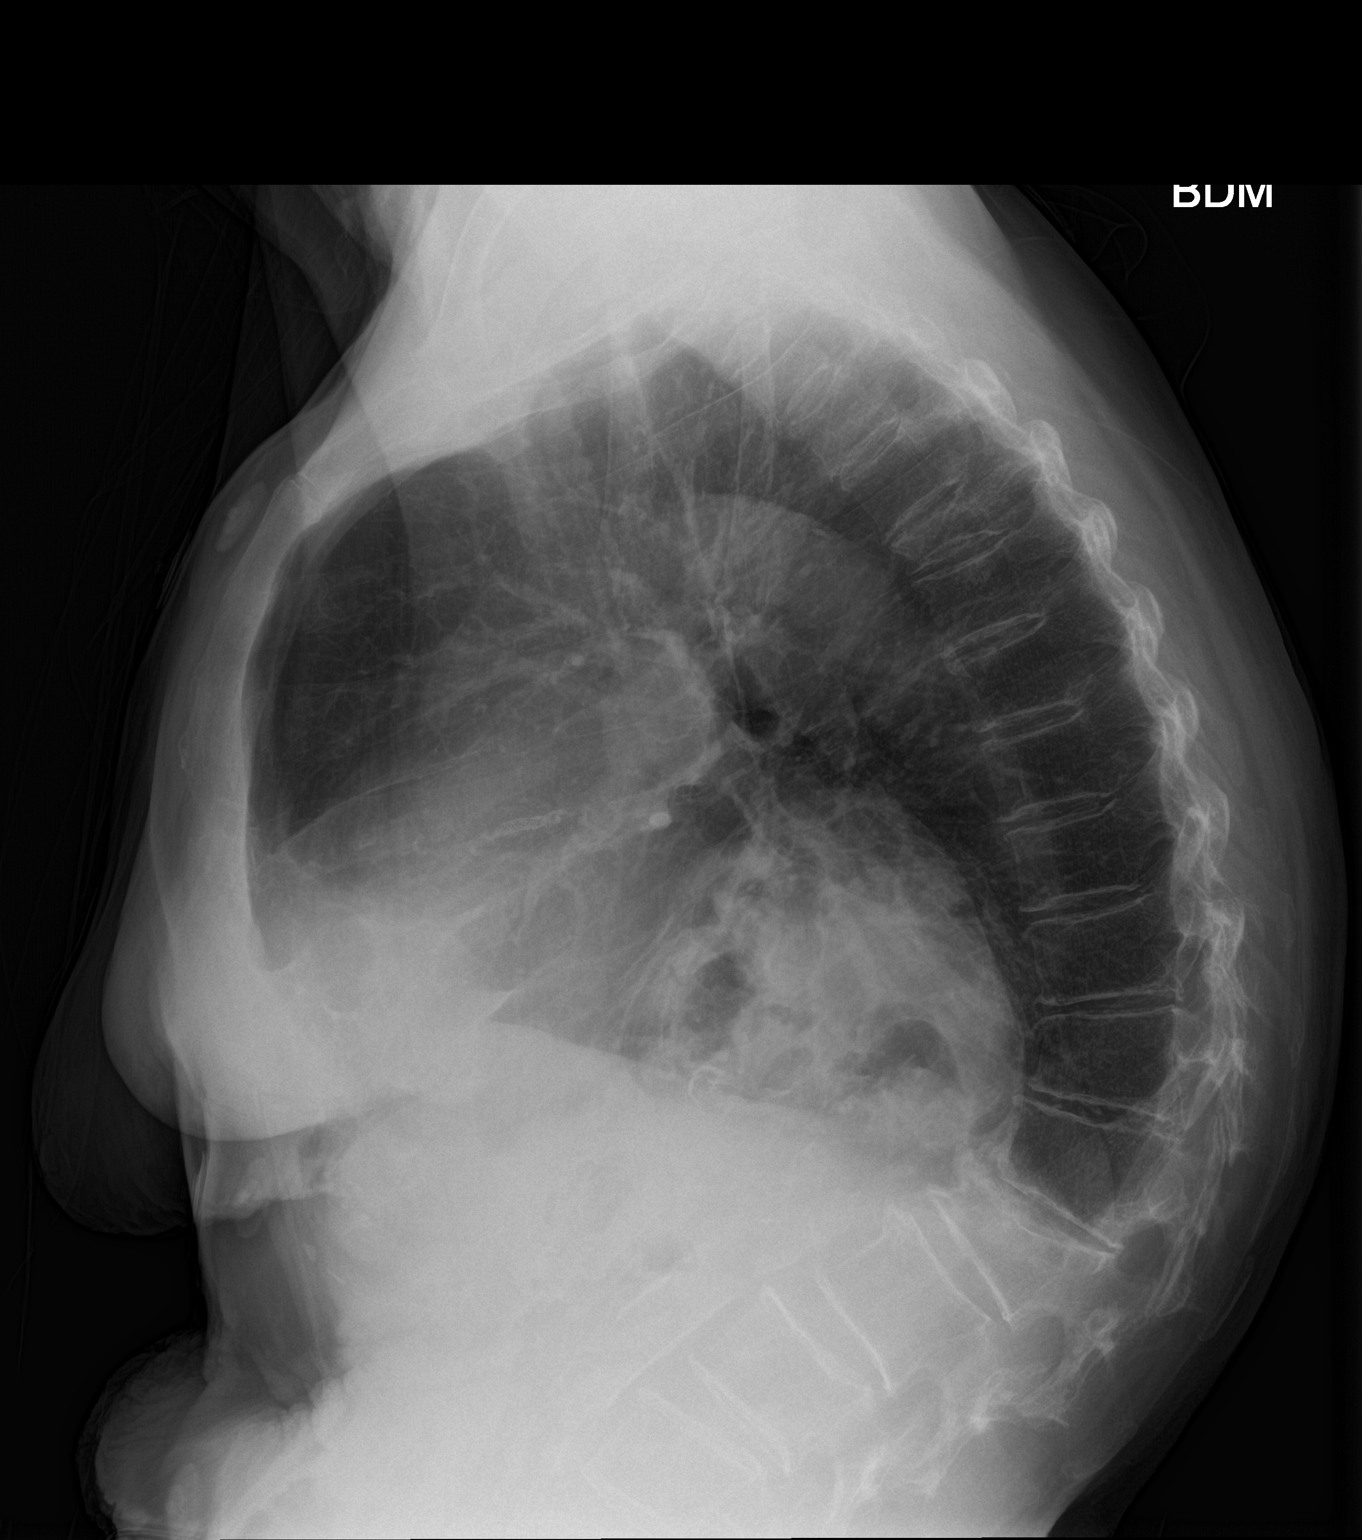

[2 of 2 positions shown; findings below may reference images not displayed]

FINDINGS: Lungs are adequately inflated without consolidation or effusion.
There is moderate cardiomegaly. There is moderate calcified plaque
over the thoracoabdominal aorta. There is a large
hiatal/diaphragmatic hernia unchanged. Compression fracture over the
upper and lower thoracic spine unchanged.
IMPRESSION: No acute cardiopulmonary disease.

Stable cardiomegaly.

Stable large hiatal/ diaphragmatic hernia.

Stable thoracic spinal compression fractures.

## 2015-12-10 DIAGNOSIS — J418 Mixed simple and mucopurulent chronic bronchitis: Secondary | ICD-10-CM | POA: Diagnosis not present

## 2015-12-10 DIAGNOSIS — I2510 Atherosclerotic heart disease of native coronary artery without angina pectoris: Secondary | ICD-10-CM | POA: Diagnosis not present

## 2015-12-10 DIAGNOSIS — I10 Essential (primary) hypertension: Secondary | ICD-10-CM | POA: Diagnosis not present

## 2015-12-10 DIAGNOSIS — E782 Mixed hyperlipidemia: Secondary | ICD-10-CM | POA: Diagnosis not present

## 2015-12-10 DIAGNOSIS — K219 Gastro-esophageal reflux disease without esophagitis: Secondary | ICD-10-CM | POA: Diagnosis not present

## 2016-02-03 DIAGNOSIS — Z0000 Encounter for general adult medical examination without abnormal findings: Secondary | ICD-10-CM | POA: Diagnosis not present

## 2016-02-03 DIAGNOSIS — Z6822 Body mass index (BMI) 22.0-22.9, adult: Secondary | ICD-10-CM | POA: Diagnosis not present

## 2016-02-19 DIAGNOSIS — R0602 Shortness of breath: Secondary | ICD-10-CM | POA: Diagnosis not present

## 2016-02-19 DIAGNOSIS — E010 Iodine-deficiency related diffuse (endemic) goiter: Secondary | ICD-10-CM | POA: Diagnosis not present

## 2016-02-19 DIAGNOSIS — I2542 Coronary artery dissection: Secondary | ICD-10-CM | POA: Diagnosis not present

## 2016-02-19 DIAGNOSIS — J449 Chronic obstructive pulmonary disease, unspecified: Secondary | ICD-10-CM | POA: Diagnosis not present

## 2016-03-05 DIAGNOSIS — E010 Iodine-deficiency related diffuse (endemic) goiter: Secondary | ICD-10-CM | POA: Diagnosis not present

## 2016-03-05 DIAGNOSIS — R0602 Shortness of breath: Secondary | ICD-10-CM | POA: Diagnosis not present

## 2016-03-05 DIAGNOSIS — J449 Chronic obstructive pulmonary disease, unspecified: Secondary | ICD-10-CM | POA: Diagnosis not present

## 2016-03-05 DIAGNOSIS — E042 Nontoxic multinodular goiter: Secondary | ICD-10-CM | POA: Diagnosis not present

## 2016-03-26 DIAGNOSIS — I071 Rheumatic tricuspid insufficiency: Secondary | ICD-10-CM | POA: Diagnosis not present

## 2016-03-26 DIAGNOSIS — I519 Heart disease, unspecified: Secondary | ICD-10-CM | POA: Diagnosis not present

## 2016-03-26 DIAGNOSIS — I351 Nonrheumatic aortic (valve) insufficiency: Secondary | ICD-10-CM | POA: Diagnosis not present

## 2016-03-26 DIAGNOSIS — I517 Cardiomegaly: Secondary | ICD-10-CM | POA: Diagnosis not present

## 2016-03-31 DIAGNOSIS — Z23 Encounter for immunization: Secondary | ICD-10-CM | POA: Diagnosis not present

## 2016-03-31 DIAGNOSIS — E782 Mixed hyperlipidemia: Secondary | ICD-10-CM | POA: Diagnosis not present

## 2016-03-31 DIAGNOSIS — I10 Essential (primary) hypertension: Secondary | ICD-10-CM | POA: Diagnosis not present

## 2016-03-31 DIAGNOSIS — Z9981 Dependence on supplemental oxygen: Secondary | ICD-10-CM | POA: Diagnosis not present

## 2016-03-31 DIAGNOSIS — Z6823 Body mass index (BMI) 23.0-23.9, adult: Secondary | ICD-10-CM | POA: Diagnosis not present

## 2016-03-31 DIAGNOSIS — I2510 Atherosclerotic heart disease of native coronary artery without angina pectoris: Secondary | ICD-10-CM | POA: Diagnosis not present

## 2016-03-31 DIAGNOSIS — E010 Iodine-deficiency related diffuse (endemic) goiter: Secondary | ICD-10-CM | POA: Diagnosis not present

## 2016-03-31 DIAGNOSIS — J418 Mixed simple and mucopurulent chronic bronchitis: Secondary | ICD-10-CM | POA: Diagnosis not present

## 2016-04-24 DIAGNOSIS — D649 Anemia, unspecified: Secondary | ICD-10-CM | POA: Diagnosis not present

## 2016-04-24 DIAGNOSIS — I10 Essential (primary) hypertension: Secondary | ICD-10-CM | POA: Diagnosis not present

## 2016-04-24 DIAGNOSIS — I519 Heart disease, unspecified: Secondary | ICD-10-CM | POA: Diagnosis not present

## 2016-04-24 DIAGNOSIS — J449 Chronic obstructive pulmonary disease, unspecified: Secondary | ICD-10-CM | POA: Diagnosis not present

## 2016-04-24 DIAGNOSIS — Z955 Presence of coronary angioplasty implant and graft: Secondary | ICD-10-CM | POA: Diagnosis not present

## 2016-04-24 DIAGNOSIS — Z9981 Dependence on supplemental oxygen: Secondary | ICD-10-CM | POA: Diagnosis not present

## 2016-04-24 DIAGNOSIS — I252 Old myocardial infarction: Secondary | ICD-10-CM | POA: Diagnosis not present

## 2016-04-24 DIAGNOSIS — I255 Ischemic cardiomyopathy: Secondary | ICD-10-CM | POA: Diagnosis not present

## 2016-04-24 DIAGNOSIS — E785 Hyperlipidemia, unspecified: Secondary | ICD-10-CM | POA: Diagnosis not present

## 2016-04-24 DIAGNOSIS — I25118 Atherosclerotic heart disease of native coronary artery with other forms of angina pectoris: Secondary | ICD-10-CM | POA: Diagnosis not present

## 2016-04-24 DIAGNOSIS — R0609 Other forms of dyspnea: Secondary | ICD-10-CM | POA: Diagnosis not present

## 2016-04-24 DIAGNOSIS — Z87891 Personal history of nicotine dependence: Secondary | ICD-10-CM | POA: Diagnosis not present

## 2016-05-04 DIAGNOSIS — J984 Other disorders of lung: Secondary | ICD-10-CM | POA: Diagnosis not present

## 2016-05-04 DIAGNOSIS — Z7722 Contact with and (suspected) exposure to environmental tobacco smoke (acute) (chronic): Secondary | ICD-10-CM | POA: Diagnosis not present

## 2016-05-04 DIAGNOSIS — Z87891 Personal history of nicotine dependence: Secondary | ICD-10-CM | POA: Diagnosis not present

## 2016-06-15 DIAGNOSIS — Z6825 Body mass index (BMI) 25.0-25.9, adult: Secondary | ICD-10-CM | POA: Diagnosis not present

## 2016-06-15 DIAGNOSIS — E785 Hyperlipidemia, unspecified: Secondary | ICD-10-CM | POA: Diagnosis not present

## 2016-06-15 DIAGNOSIS — I25119 Atherosclerotic heart disease of native coronary artery with unspecified angina pectoris: Secondary | ICD-10-CM | POA: Diagnosis not present

## 2016-06-15 DIAGNOSIS — I5032 Chronic diastolic (congestive) heart failure: Secondary | ICD-10-CM | POA: Diagnosis not present

## 2016-06-15 DIAGNOSIS — Z87891 Personal history of nicotine dependence: Secondary | ICD-10-CM | POA: Diagnosis not present

## 2016-06-15 DIAGNOSIS — I110 Hypertensive heart disease with heart failure: Secondary | ICD-10-CM | POA: Diagnosis not present

## 2016-06-15 DIAGNOSIS — Z955 Presence of coronary angioplasty implant and graft: Secondary | ICD-10-CM | POA: Diagnosis not present

## 2016-06-15 DIAGNOSIS — J449 Chronic obstructive pulmonary disease, unspecified: Secondary | ICD-10-CM | POA: Diagnosis not present

## 2016-10-12 DIAGNOSIS — I5032 Chronic diastolic (congestive) heart failure: Secondary | ICD-10-CM | POA: Diagnosis not present

## 2016-10-12 DIAGNOSIS — Z7982 Long term (current) use of aspirin: Secondary | ICD-10-CM | POA: Diagnosis not present

## 2016-10-12 DIAGNOSIS — Z8249 Family history of ischemic heart disease and other diseases of the circulatory system: Secondary | ICD-10-CM | POA: Diagnosis not present

## 2016-10-12 DIAGNOSIS — I2510 Atherosclerotic heart disease of native coronary artery without angina pectoris: Secondary | ICD-10-CM | POA: Diagnosis not present

## 2016-10-12 DIAGNOSIS — Z955 Presence of coronary angioplasty implant and graft: Secondary | ICD-10-CM | POA: Diagnosis not present

## 2016-10-12 DIAGNOSIS — Z7902 Long term (current) use of antithrombotics/antiplatelets: Secondary | ICD-10-CM | POA: Diagnosis not present

## 2016-10-12 DIAGNOSIS — J449 Chronic obstructive pulmonary disease, unspecified: Secondary | ICD-10-CM | POA: Diagnosis not present

## 2016-10-12 DIAGNOSIS — Z9981 Dependence on supplemental oxygen: Secondary | ICD-10-CM | POA: Diagnosis not present

## 2016-10-12 DIAGNOSIS — I252 Old myocardial infarction: Secondary | ICD-10-CM | POA: Diagnosis not present

## 2016-10-12 DIAGNOSIS — Z87891 Personal history of nicotine dependence: Secondary | ICD-10-CM | POA: Diagnosis not present

## 2016-10-12 DIAGNOSIS — I119 Hypertensive heart disease without heart failure: Secondary | ICD-10-CM | POA: Diagnosis not present

## 2016-10-12 DIAGNOSIS — E785 Hyperlipidemia, unspecified: Secondary | ICD-10-CM | POA: Diagnosis not present

## 2016-10-19 DIAGNOSIS — I5032 Chronic diastolic (congestive) heart failure: Secondary | ICD-10-CM | POA: Diagnosis not present

## 2016-10-19 DIAGNOSIS — I2510 Atherosclerotic heart disease of native coronary artery without angina pectoris: Secondary | ICD-10-CM | POA: Diagnosis not present

## 2016-10-19 DIAGNOSIS — R0602 Shortness of breath: Secondary | ICD-10-CM | POA: Diagnosis not present

## 2016-12-26 DIAGNOSIS — J441 Chronic obstructive pulmonary disease with (acute) exacerbation: Secondary | ICD-10-CM | POA: Diagnosis not present

## 2017-02-03 DIAGNOSIS — Z0000 Encounter for general adult medical examination without abnormal findings: Secondary | ICD-10-CM | POA: Diagnosis not present

## 2017-02-03 DIAGNOSIS — Z23 Encounter for immunization: Secondary | ICD-10-CM | POA: Diagnosis not present

## 2020-12-14 DEATH — deceased
